# Patient Record
Sex: Female | Born: 1970 | Hispanic: No | Marital: Married | Smoking: Never smoker
Health system: Southern US, Community
[De-identification: ages and names within clinical notes are randomized; demographics above are authoritative.]

## PROBLEM LIST (undated history)

## (undated) DIAGNOSIS — M549 Dorsalgia, unspecified: Secondary | ICD-10-CM

## (undated) DIAGNOSIS — F419 Anxiety disorder, unspecified: Secondary | ICD-10-CM

## (undated) DIAGNOSIS — T7840XA Allergy, unspecified, initial encounter: Secondary | ICD-10-CM

## (undated) HISTORY — PX: BREAST CYST ASPIRATION: SHX578

## (undated) HISTORY — DX: Allergy, unspecified, initial encounter: T78.40XA

## (undated) HISTORY — DX: Anxiety disorder, unspecified: F41.9

---

## 2011-05-14 DIAGNOSIS — G43909 Migraine, unspecified, not intractable, without status migrainosus: Secondary | ICD-10-CM | POA: Insufficient documentation

## 2011-05-15 DIAGNOSIS — E559 Vitamin D deficiency, unspecified: Secondary | ICD-10-CM | POA: Insufficient documentation

## 2011-11-16 ENCOUNTER — Other Ambulatory Visit: Payer: Self-pay | Admitting: Family Medicine

## 2011-11-16 DIAGNOSIS — N6320 Unspecified lump in the left breast, unspecified quadrant: Secondary | ICD-10-CM

## 2011-11-16 DIAGNOSIS — N6310 Unspecified lump in the right breast, unspecified quadrant: Secondary | ICD-10-CM

## 2011-11-19 ENCOUNTER — Ambulatory Visit
Admission: RE | Admit: 2011-11-19 | Discharge: 2011-11-19 | Disposition: A | Discharge status: Home / Self Care | Payer: BC Managed Care – PPO | Source: Ambulatory Visit | Attending: Family Medicine | Admitting: Family Medicine

## 2011-11-19 ENCOUNTER — Other Ambulatory Visit: Payer: Self-pay | Admitting: Family Medicine

## 2011-11-19 DIAGNOSIS — N6320 Unspecified lump in the left breast, unspecified quadrant: Secondary | ICD-10-CM

## 2011-12-24 DIAGNOSIS — N6019 Diffuse cystic mastopathy of unspecified breast: Secondary | ICD-10-CM | POA: Insufficient documentation

## 2011-12-24 DIAGNOSIS — J309 Allergic rhinitis, unspecified: Secondary | ICD-10-CM | POA: Insufficient documentation

## 2012-11-16 ENCOUNTER — Other Ambulatory Visit: Payer: Self-pay | Admitting: Obstetrics and Gynecology

## 2012-11-16 DIAGNOSIS — N6320 Unspecified lump in the left breast, unspecified quadrant: Secondary | ICD-10-CM

## 2012-11-25 ENCOUNTER — Ambulatory Visit
Admission: RE | Admit: 2012-11-25 | Discharge: 2012-11-25 | Disposition: A | Discharge status: Home / Self Care | Payer: BC Managed Care – PPO | Source: Ambulatory Visit | Attending: Obstetrics and Gynecology | Admitting: Obstetrics and Gynecology

## 2012-11-25 ENCOUNTER — Other Ambulatory Visit: Payer: Self-pay | Admitting: Obstetrics and Gynecology

## 2012-11-25 DIAGNOSIS — N6320 Unspecified lump in the left breast, unspecified quadrant: Secondary | ICD-10-CM

## 2013-02-14 ENCOUNTER — Other Ambulatory Visit: Payer: Self-pay | Admitting: Obstetrics and Gynecology

## 2013-02-14 DIAGNOSIS — N644 Mastodynia: Secondary | ICD-10-CM

## 2013-02-14 DIAGNOSIS — N6320 Unspecified lump in the left breast, unspecified quadrant: Secondary | ICD-10-CM

## 2013-02-27 ENCOUNTER — Other Ambulatory Visit: Payer: BC Managed Care – PPO

## 2013-03-02 ENCOUNTER — Ambulatory Visit
Admission: RE | Admit: 2013-03-02 | Discharge: 2013-03-02 | Disposition: A | Discharge status: Home / Self Care | Payer: BC Managed Care – PPO | Source: Ambulatory Visit | Attending: Obstetrics and Gynecology | Admitting: Obstetrics and Gynecology

## 2013-03-02 ENCOUNTER — Other Ambulatory Visit: Payer: Self-pay | Admitting: Obstetrics and Gynecology

## 2013-03-02 DIAGNOSIS — N6320 Unspecified lump in the left breast, unspecified quadrant: Secondary | ICD-10-CM

## 2013-03-02 DIAGNOSIS — N644 Mastodynia: Secondary | ICD-10-CM

## 2015-06-17 ENCOUNTER — Other Ambulatory Visit: Payer: Self-pay | Admitting: Obstetrics and Gynecology

## 2015-06-17 ENCOUNTER — Encounter: Payer: Self-pay | Admitting: Internal Medicine

## 2015-06-17 DIAGNOSIS — N6310 Unspecified lump in the right breast, unspecified quadrant: Secondary | ICD-10-CM

## 2015-06-24 ENCOUNTER — Other Ambulatory Visit: Payer: Self-pay

## 2015-06-25 ENCOUNTER — Ambulatory Visit: Payer: Self-pay | Admitting: Internal Medicine

## 2015-06-26 ENCOUNTER — Other Ambulatory Visit: Payer: Self-pay | Admitting: Obstetrics and Gynecology

## 2015-06-26 ENCOUNTER — Ambulatory Visit
Admission: RE | Admit: 2015-06-26 | Discharge: 2015-06-26 | Disposition: A | Discharge status: Home / Self Care | Payer: BLUE CROSS/BLUE SHIELD | Source: Ambulatory Visit | Attending: Obstetrics and Gynecology | Admitting: Obstetrics and Gynecology

## 2015-06-26 DIAGNOSIS — N6320 Unspecified lump in the left breast, unspecified quadrant: Secondary | ICD-10-CM

## 2015-06-26 DIAGNOSIS — N6310 Unspecified lump in the right breast, unspecified quadrant: Secondary | ICD-10-CM

## 2016-06-18 DIAGNOSIS — R42 Dizziness and giddiness: Secondary | ICD-10-CM | POA: Diagnosis not present

## 2016-06-18 DIAGNOSIS — Z01419 Encounter for gynecological examination (general) (routine) without abnormal findings: Secondary | ICD-10-CM | POA: Diagnosis not present

## 2016-06-18 DIAGNOSIS — Z6824 Body mass index (BMI) 24.0-24.9, adult: Secondary | ICD-10-CM | POA: Diagnosis not present

## 2016-06-18 DIAGNOSIS — N951 Menopausal and female climacteric states: Secondary | ICD-10-CM | POA: Diagnosis not present

## 2016-06-24 DIAGNOSIS — Z23 Encounter for immunization: Secondary | ICD-10-CM | POA: Diagnosis not present

## 2016-06-29 DIAGNOSIS — Z1231 Encounter for screening mammogram for malignant neoplasm of breast: Secondary | ICD-10-CM | POA: Diagnosis not present

## 2016-06-29 DIAGNOSIS — N6002 Solitary cyst of left breast: Secondary | ICD-10-CM | POA: Diagnosis not present

## 2016-09-15 DIAGNOSIS — R8299 Other abnormal findings in urine: Secondary | ICD-10-CM | POA: Diagnosis not present

## 2016-09-15 DIAGNOSIS — Z0000 Encounter for general adult medical examination without abnormal findings: Secondary | ICD-10-CM | POA: Diagnosis not present

## 2016-10-02 DIAGNOSIS — Z1389 Encounter for screening for other disorder: Secondary | ICD-10-CM | POA: Diagnosis not present

## 2016-10-02 DIAGNOSIS — Z0000 Encounter for general adult medical examination without abnormal findings: Secondary | ICD-10-CM | POA: Diagnosis not present

## 2016-10-02 DIAGNOSIS — M2012 Hallux valgus (acquired), left foot: Secondary | ICD-10-CM | POA: Diagnosis not present

## 2016-10-02 DIAGNOSIS — R5383 Other fatigue: Secondary | ICD-10-CM | POA: Diagnosis not present

## 2016-10-02 DIAGNOSIS — M6530 Trigger finger, unspecified finger: Secondary | ICD-10-CM | POA: Diagnosis not present

## 2016-10-02 DIAGNOSIS — M25519 Pain in unspecified shoulder: Secondary | ICD-10-CM | POA: Diagnosis not present

## 2016-10-16 ENCOUNTER — Ambulatory Visit: Payer: BLUE CROSS/BLUE SHIELD | Admitting: Podiatry

## 2016-10-16 DIAGNOSIS — G5602 Carpal tunnel syndrome, left upper limb: Secondary | ICD-10-CM | POA: Diagnosis not present

## 2016-10-16 DIAGNOSIS — M47812 Spondylosis without myelopathy or radiculopathy, cervical region: Secondary | ICD-10-CM | POA: Diagnosis not present

## 2016-10-16 DIAGNOSIS — M069 Rheumatoid arthritis, unspecified: Secondary | ICD-10-CM | POA: Diagnosis not present

## 2016-10-16 DIAGNOSIS — G5603 Carpal tunnel syndrome, bilateral upper limbs: Secondary | ICD-10-CM | POA: Diagnosis not present

## 2016-10-22 ENCOUNTER — Encounter: Payer: Self-pay | Admitting: Podiatry

## 2016-10-22 ENCOUNTER — Ambulatory Visit (INDEPENDENT_AMBULATORY_CARE_PROVIDER_SITE_OTHER): Payer: BLUE CROSS/BLUE SHIELD

## 2016-10-22 ENCOUNTER — Ambulatory Visit (INDEPENDENT_AMBULATORY_CARE_PROVIDER_SITE_OTHER): Payer: BLUE CROSS/BLUE SHIELD | Admitting: Podiatry

## 2016-10-22 VITALS — BP 100/60 | HR 74 | Resp 16 | Ht 64.0 in | Wt 146.0 lb

## 2016-10-22 DIAGNOSIS — M21619 Bunion of unspecified foot: Secondary | ICD-10-CM | POA: Diagnosis not present

## 2016-10-22 DIAGNOSIS — M2010 Hallux valgus (acquired), unspecified foot: Secondary | ICD-10-CM

## 2016-10-22 NOTE — Patient Instructions (Signed)
Bunionectomy A bunionectomy is a surgical procedure to remove a bunion. A bunion is a visible bump of bone on the inside of your foot where your big toe meets the rest of your foot. A bunion can develop when pressure turns this bone (first metatarsal) toward the other toes. Shoes that are too tight are the most common cause of bunions. Bunions can also be caused by diseases, such as arthritis and polio. You may need a bunionectomy if your bunion is very large and painful or it affects your ability to walk. Tell a health care provider about:  Any allergies you have.  All medicines you are taking, including vitamins, herbs, eye drops, creams, and over-the-counter medicines.  Any problems you or family members have had with anesthetic medicines.  Any blood disorders you have.  Any surgeries you have had.  Any medical conditions you have. What are the risks? Generally, this is a safe procedure. However, problems may occur, including:  Infection.  Pain.  Nerve damage.  Bleeding or blood clots.  Reactions to medicines.  Numbness, stiffness, or arthritis in your toe.  Foot problems that continue even after the procedure. What happens before the procedure?  Ask your health care provider about:  Changing or stopping your regular medicines. This is especially important if you are taking diabetes medicines or blood thinners.  Taking medicines such as aspirin and ibuprofen. These medicines can thin your blood. Do not take these medicines before your procedure if your health care provider instructs you not to.  Do not drink alcohol before the procedure as directed by your health care provider.  Do not use tobacco products, including cigarettes, chewing tobacco, or electronic cigarettes, before the procedure as directed by your health care provider. If you need help quitting, ask your health care provider.  Ask your health care provider what kind of medicine you will be given during  your procedure. A bunionectomy may be done using one of these:  A medicine that numbs the area (local anesthetic).  A medicine that makes you go to sleep (general anesthetic). If you will be given general anesthetic, do not eat or drink anything after midnight on the night before the procedure or as directed by your health care provider. What happens during the procedure?  An IV tube may be inserted into a vein.  You will be given local anesthetic or general anesthetic.  The surgeon will make a cut (incision) over the enlarged area at the first joint of the big toe. The surgeon will remove the bunion.  You may have more than one incision if any of the bones in your big toe need to be moved. A bone itself may need to be cut.  Sometimes the tissues around the big toe may also need to be cut then tightened or loosened to reposition the toe.  Screws or other hardware may be used to keep your foot in thecorrect position.  The incision will be closed with stitches (sutures) and covered with adhesive strips or another type of bandage (dressing). What happens after the procedure?  You may spend some time in a recovery area.  Your blood pressure, heart rate, breathing rate, and blood oxygen level will be monitored often until the medicines you were given have worn off. This information is not intended to replace advice given to you by your health care provider. Make sure you discuss any questions you have with your health care provider. Document Released: 07/24/2005 Document Revised: 01/16/2016 Document Reviewed: 03/28/2014   [employer]vier Interactive Patient Education  2017 [employer]  

## 2016-10-22 NOTE — Progress Notes (Signed)
   Subjective:    Patient ID: Madeline Wallace, female    DOB: 1970/11/25, 46 y.o.   MRN: [id number]  HPI Chief Complaint  Patient presents with  . Foot Pain    Bilateral; bunions; pt stated, "Left foot is painful and getting worse"      Review of Systems  Constitutional: Positive for appetite change and unexpected weight change.  Gastrointestinal: Positive for abdominal distention.  Musculoskeletal: Positive for myalgias.  All other systems reviewed and are negative.      Objective:   Physical Exam        Assessment & Plan:

## 2016-10-23 NOTE — Progress Notes (Signed)
Subjective:     Patient ID: Madeline Wallace, female   DOB: 1970-09-26, 46 y.o.   MRN: [id number]  HPI patient presents with painful bunion deformity left over right foot with inability to wear shoe gear comfortably and states she's tried wider shoes she's tried soaks without relief of symptoms and it's been going on for a fairly long time and worse over the last several years   Review of Systems  All other systems reviewed and are negative.      Objective:   Physical Exam  Constitutional: She is oriented to person, place, and time.  Cardiovascular: Intact distal pulses.   Musculoskeletal: Normal range of motion.  Neurological: She is oriented to person, place, and time.  Skin: Skin is warm.  Nursing note and vitals reviewed.  neurovascular status intact muscle strength adequate range of motion within normal limits with patient found to have hyperostosis medial aspect first metatarsal head left with redness around the bone surface and pain when palpated. The right is mildly enlarged with no where near the degree of redness or pain associated with it. Patient's found have good digital perfusion and is well oriented 3     Assessment:     Structural bunion deformity left over right with pain redness and deformity    Plan:     H&P x-rays reviewed condition discussed at great length. Patient wants to have this fixed and I recommended distal osteotomy reviewing procedure and recovery. She is scheduled for surgery in the next several months and will be seen back prior to procedure for consult to go over in great detail what we'll be done with this particular procedure  X-rays taken today indicated elevation of the intermetatarsal angle left over right of approximate 15. Patient has mild assisting within the first metatarsal head but it is localized to the medial side

## 2016-11-09 ENCOUNTER — Ambulatory Visit (INDEPENDENT_AMBULATORY_CARE_PROVIDER_SITE_OTHER): Payer: BLUE CROSS/BLUE SHIELD | Admitting: Podiatry

## 2016-11-09 ENCOUNTER — Encounter: Payer: Self-pay | Admitting: Podiatry

## 2016-11-09 DIAGNOSIS — M21619 Bunion of unspecified foot: Secondary | ICD-10-CM

## 2016-11-09 NOTE — Patient Instructions (Signed)

## 2016-11-09 NOTE — Progress Notes (Signed)
Subjective:     Patient ID: Madeline Wallace, female   DOB: [birth year], 46 y.o.   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 I explained the surgery and the fact there will be pin fixation and the fact recovery takes proximally 6 months. Patient wants surgery signed consent form is given all preoperative instructions and is encouraged to call with questions and today I did go ahead and dispensed air fracture walker for the postop and explained for her to get used to before and find shoes that are comfortable on her other foot

## 2016-11-16 ENCOUNTER — Telehealth: Payer: Self-pay | Admitting: *Deleted

## 2016-11-16 NOTE — Telephone Encounter (Signed)
"  I was told to call you to see if you got any more boots in.  When I was there, I was given a large one but I was told I could get a smaller one when it came in.  I was told that a small one was too little."  We do have some in now.  You can bring the one you have and exchange it for a smaller boot.  "I will come by today."

## 2016-11-20 ENCOUNTER — Telehealth: Payer: Self-pay

## 2016-11-20 NOTE — Telephone Encounter (Signed)
Returned patient's call, answering concerns regarding anesthesia and surgery. She is to call with any other questions or concerns

## 2016-11-24 ENCOUNTER — Encounter: Payer: Self-pay | Admitting: Podiatry

## 2016-11-24 DIAGNOSIS — Z01818 Encounter for other preprocedural examination: Secondary | ICD-10-CM | POA: Diagnosis not present

## 2016-11-24 DIAGNOSIS — M2012 Hallux valgus (acquired), left foot: Secondary | ICD-10-CM | POA: Diagnosis not present

## 2016-11-24 DIAGNOSIS — M21612 Bunion of left foot: Secondary | ICD-10-CM | POA: Diagnosis not present

## 2016-11-27 ENCOUNTER — Telehealth: Payer: Self-pay | Admitting: *Deleted

## 2016-11-27 MED ORDER — HYDROCODONE-ACETAMINOPHEN 5-325 MG PO TABS: 1.0000 | ORAL_TABLET | Freq: Four times a day (QID) | ORAL | 0 refills | Status: DC | PRN

## 2016-11-27 MED ORDER — PROMETHAZINE HCL 25 MG PO TABS: ORAL_TABLET | ORAL | 0 refills | Status: DC

## 2016-11-27 NOTE — Telephone Encounter (Addendum)
Pt's husband, Arlys John states pt is not doing well, she feels faint, sick and constipated. I spoke with pt and she states she is getting sick from the pain medication oxycodone, and the ondansetron can only be taken every 8 hours. I asked pt if she was taking the oxycodone with food and she states no because she is too sick to eat. I told pt I would seeif the doctor on call would change the pain medication and the nausea medication to one she could take every 4-6 hours, and I would call again. Dr. Ardelle Anton ordered Hydrocodone 5/325mg  #40 one or two tablets every 6 hours prn foot pain, and Phenergan  #30 one tablet every 4-6 hours prn nausea. I informed pt's husband, Arlys John of Dr. Gabriel Rung orders and to bring the Percocet to office to be destroyed. Arlys John states understanding.

## 2016-11-30 ENCOUNTER — Ambulatory Visit: Payer: BLUE CROSS/BLUE SHIELD

## 2016-11-30 ENCOUNTER — Ambulatory Visit (INDEPENDENT_AMBULATORY_CARE_PROVIDER_SITE_OTHER): Payer: BLUE CROSS/BLUE SHIELD | Admitting: Podiatry

## 2016-11-30 ENCOUNTER — Encounter: Payer: Self-pay | Admitting: Podiatry

## 2016-11-30 VITALS — BP 112/72 | HR 70 | Temp 96.5°F

## 2016-11-30 DIAGNOSIS — Z9889 Other specified postprocedural states: Secondary | ICD-10-CM

## 2016-11-30 DIAGNOSIS — M21619 Bunion of unspecified foot: Secondary | ICD-10-CM

## 2016-11-30 NOTE — Progress Notes (Signed)
Subjective:     Patient ID: Madeline Wallace, female   DOB: 09-26-1970, 46 y.o.   MRN: [id number]  HPI patient presents stating she still getting swelling but overall is doing okay   Review of Systems     Objective:   Physical Exam Neurovascular status intact with patient found to have a well structured bunion site correction with wound edges well coapted and good alignment    Assessment:     Doing well overall with mild edema consistent with this. Postop    Plan:     Reviewed continued conservative care and reapplied sterile dressing along with continued elevation compression immobilization. Explained range of motion exercises and reviewed x-ray  X-ray indicates osteotomy is healing well with good positional component and fixation in place

## 2016-12-14 ENCOUNTER — Ambulatory Visit (INDEPENDENT_AMBULATORY_CARE_PROVIDER_SITE_OTHER): Payer: BLUE CROSS/BLUE SHIELD

## 2016-12-14 ENCOUNTER — Ambulatory Visit (INDEPENDENT_AMBULATORY_CARE_PROVIDER_SITE_OTHER): Payer: BLUE CROSS/BLUE SHIELD | Admitting: Podiatry

## 2016-12-14 ENCOUNTER — Encounter: Payer: Self-pay | Admitting: Podiatry

## 2016-12-14 VITALS — BP 112/65 | HR 87 | Resp 16

## 2016-12-14 DIAGNOSIS — J309 Allergic rhinitis, unspecified: Secondary | ICD-10-CM | POA: Diagnosis not present

## 2016-12-14 DIAGNOSIS — M21619 Bunion of unspecified foot: Secondary | ICD-10-CM

## 2016-12-14 DIAGNOSIS — Z9889 Other specified postprocedural states: Secondary | ICD-10-CM

## 2016-12-14 DIAGNOSIS — R42 Dizziness and giddiness: Secondary | ICD-10-CM | POA: Diagnosis not present

## 2016-12-14 DIAGNOSIS — Z6824 Body mass index (BMI) 24.0-24.9, adult: Secondary | ICD-10-CM | POA: Diagnosis not present

## 2016-12-14 NOTE — Progress Notes (Signed)
Subjective:    Patient ID: Madeline Wallace, female   DOB: 46 y.o.   MRN: [id number]   HPI patient presents stating the pain is getting better but it still present    ROS      Objective:  Physical Exam  Neurovascular status intact negative Homans sign noted with well structured first MPJ left with wound edges well coapted and good range of motion    Assessment:     Doing well post osteotomy left first metatarsal    Plan:     H&P condition reviewed and recommended gradual increase in activities with continued boot usage surgical shoe usage compression and elevation. Reappoint 3 weeks or earlier if needed  X-ray report indicates the osteotomy is healing well with good alignment noted pins in place and good correction occurring

## 2017-01-01 NOTE — Progress Notes (Signed)
DOS 11/24/2016 [place] Bunionectomy (cutting and moving bone) with pin fixation left

## 2017-01-04 ENCOUNTER — Ambulatory Visit (INDEPENDENT_AMBULATORY_CARE_PROVIDER_SITE_OTHER): Payer: BLUE CROSS/BLUE SHIELD

## 2017-01-04 ENCOUNTER — Ambulatory Visit (INDEPENDENT_AMBULATORY_CARE_PROVIDER_SITE_OTHER): Payer: BLUE CROSS/BLUE SHIELD | Admitting: Podiatry

## 2017-01-04 DIAGNOSIS — Z9889 Other specified postprocedural states: Secondary | ICD-10-CM

## 2017-01-04 DIAGNOSIS — M21619 Bunion of unspecified foot: Secondary | ICD-10-CM | POA: Diagnosis not present

## 2017-01-05 NOTE — Progress Notes (Signed)
Subjective:    Patient ID: Madeline Wallace, female   DOB: 46 y.o.   MRN: [id number]   HPI patient states she is doing well with her surgery    ROS      Objective:  Physical Exam Neurovascular status intact negative Homans sign was noted with patient's left foot doing well with wound edges well coapted hallux in rectus position and good range of motion of the first MPJ    Assessment:    Doing well post osteotomy first metatarsal left     Plan:     H&P condition reviewed and recommended continued shoe gear usage gradual return to activity and reappoint again in 6 weeks with x-ray review accomplished today  X-ray indicates the osteotomy is healing well with pins in place and no indications of movement

## 2017-02-15 ENCOUNTER — Ambulatory Visit (INDEPENDENT_AMBULATORY_CARE_PROVIDER_SITE_OTHER): Payer: BLUE CROSS/BLUE SHIELD

## 2017-02-15 ENCOUNTER — Ambulatory Visit (INDEPENDENT_AMBULATORY_CARE_PROVIDER_SITE_OTHER): Payer: BLUE CROSS/BLUE SHIELD | Admitting: Podiatry

## 2017-02-15 DIAGNOSIS — Z9889 Other specified postprocedural states: Secondary | ICD-10-CM

## 2017-02-15 DIAGNOSIS — M21619 Bunion of unspecified foot: Secondary | ICD-10-CM

## 2017-02-15 NOTE — Progress Notes (Signed)
Subjective:    Patient ID: Madeline Wallace, female   DOB: 46 y.o.   MRN: [id number]   HPI patient states she's been in regular shoes for a month and having minimal discomfort with her left foot    ROS      Objective:  Physical Exam neurovascular status intact negative Homans sign was noted with patient's left foot healing well with wound edges well coapted good range of motion and no crepitus within the joint     Assessment:    Doing well post osteotomy left     Plan:    X-ray reviewed and advised on continued elevation compression and gradual increase in activities and gradual increase in the type of activity she does over the next 4-8 weeks  X-rays indicate the osteotomy is healing well with fixation in place and no indications of movement

## 2017-06-02 DIAGNOSIS — F4321 Adjustment disorder with depressed mood: Secondary | ICD-10-CM | POA: Diagnosis not present

## 2017-06-09 DIAGNOSIS — F4321 Adjustment disorder with depressed mood: Secondary | ICD-10-CM | POA: Diagnosis not present

## 2017-06-16 DIAGNOSIS — F4321 Adjustment disorder with depressed mood: Secondary | ICD-10-CM | POA: Diagnosis not present

## 2017-06-16 DIAGNOSIS — Z23 Encounter for immunization: Secondary | ICD-10-CM | POA: Diagnosis not present

## 2017-06-17 ENCOUNTER — Ambulatory Visit: Payer: BLUE CROSS/BLUE SHIELD | Admitting: Licensed Clinical Social Worker

## 2017-06-23 DIAGNOSIS — F4321 Adjustment disorder with depressed mood: Secondary | ICD-10-CM | POA: Diagnosis not present

## 2017-06-30 DIAGNOSIS — F4321 Adjustment disorder with depressed mood: Secondary | ICD-10-CM | POA: Diagnosis not present

## 2017-07-05 ENCOUNTER — Encounter: Payer: Self-pay | Admitting: Podiatry

## 2017-07-05 ENCOUNTER — Ambulatory Visit (INDEPENDENT_AMBULATORY_CARE_PROVIDER_SITE_OTHER): Payer: BLUE CROSS/BLUE SHIELD

## 2017-07-05 ENCOUNTER — Ambulatory Visit: Payer: BLUE CROSS/BLUE SHIELD | Admitting: Podiatry

## 2017-07-05 DIAGNOSIS — B351 Tinea unguium: Secondary | ICD-10-CM

## 2017-07-05 DIAGNOSIS — M2012 Hallux valgus (acquired), left foot: Secondary | ICD-10-CM | POA: Diagnosis not present

## 2017-07-05 DIAGNOSIS — Z472 Encounter for removal of internal fixation device: Secondary | ICD-10-CM | POA: Diagnosis not present

## 2017-07-05 NOTE — Progress Notes (Signed)
Subjective:    Patient ID: Madeline Wallace, female   DOB: 46 y.o.   MRN: [id number]   HPI patient is concerned because she still is getting some discomfort around her bunion site when she does a lot of walking. Patient has tried running in that's been hard for her. Also concerned about discoloration of 3 toenails on the left foot    ROS      Objective:  Physical Exam neurovascular status intact with patient's left foot still mildly swollen but minimal with good range of motion and no crepitus. Patient did not have pain when I palpated today     Assessment:    Mild discomfort left foot with possibility for irritation from pin or other pathology and also concerned about discoloration of 3 toenails     Plan:    H&P discussed both conditions. If the symptoms were to persist for the next several months I would consider pin removal and explained that to her and at this time I did place on a topical antifungal to reduce any fungal element of these 3 nails and discussed possible laser. Patient will be seen back as needed  X-rays indicate excellent healing with no signs of motion with pins in place

## 2017-07-07 DIAGNOSIS — F4321 Adjustment disorder with depressed mood: Secondary | ICD-10-CM | POA: Diagnosis not present

## 2017-07-14 DIAGNOSIS — M542 Cervicalgia: Secondary | ICD-10-CM | POA: Diagnosis not present

## 2017-07-14 DIAGNOSIS — M545 Low back pain: Secondary | ICD-10-CM | POA: Diagnosis not present

## 2017-07-14 DIAGNOSIS — M79672 Pain in left foot: Secondary | ICD-10-CM | POA: Diagnosis not present

## 2017-07-19 DIAGNOSIS — M542 Cervicalgia: Secondary | ICD-10-CM | POA: Diagnosis not present

## 2017-07-19 DIAGNOSIS — M79672 Pain in left foot: Secondary | ICD-10-CM | POA: Diagnosis not present

## 2017-07-19 DIAGNOSIS — M545 Low back pain: Secondary | ICD-10-CM | POA: Diagnosis not present

## 2017-07-22 DIAGNOSIS — M79672 Pain in left foot: Secondary | ICD-10-CM | POA: Diagnosis not present

## 2017-07-22 DIAGNOSIS — M545 Low back pain: Secondary | ICD-10-CM | POA: Diagnosis not present

## 2017-07-22 DIAGNOSIS — F4321 Adjustment disorder with depressed mood: Secondary | ICD-10-CM | POA: Diagnosis not present

## 2017-07-22 DIAGNOSIS — M542 Cervicalgia: Secondary | ICD-10-CM | POA: Diagnosis not present

## 2017-07-29 DIAGNOSIS — Z1231 Encounter for screening mammogram for malignant neoplasm of breast: Secondary | ICD-10-CM | POA: Diagnosis not present

## 2017-07-30 DIAGNOSIS — M79672 Pain in left foot: Secondary | ICD-10-CM | POA: Diagnosis not present

## 2017-07-30 DIAGNOSIS — M542 Cervicalgia: Secondary | ICD-10-CM | POA: Diagnosis not present

## 2017-07-30 DIAGNOSIS — M545 Low back pain: Secondary | ICD-10-CM | POA: Diagnosis not present

## 2017-08-10 DIAGNOSIS — F4321 Adjustment disorder with depressed mood: Secondary | ICD-10-CM | POA: Diagnosis not present

## 2017-09-01 DIAGNOSIS — F4321 Adjustment disorder with depressed mood: Secondary | ICD-10-CM | POA: Diagnosis not present

## 2017-09-02 DIAGNOSIS — H43393 Other vitreous opacities, bilateral: Secondary | ICD-10-CM | POA: Diagnosis not present

## 2017-09-02 DIAGNOSIS — H35363 Drusen (degenerative) of macula, bilateral: Secondary | ICD-10-CM | POA: Diagnosis not present

## 2017-09-02 DIAGNOSIS — H25013 Cortical age-related cataract, bilateral: Secondary | ICD-10-CM | POA: Diagnosis not present

## 2017-09-02 DIAGNOSIS — H11131 Conjunctival pigmentations, right eye: Secondary | ICD-10-CM | POA: Diagnosis not present

## 2017-09-08 DIAGNOSIS — F4321 Adjustment disorder with depressed mood: Secondary | ICD-10-CM | POA: Diagnosis not present

## 2017-09-16 DIAGNOSIS — F4321 Adjustment disorder with depressed mood: Secondary | ICD-10-CM | POA: Diagnosis not present

## 2017-09-22 DIAGNOSIS — F4321 Adjustment disorder with depressed mood: Secondary | ICD-10-CM | POA: Diagnosis not present

## 2017-09-29 DIAGNOSIS — F4321 Adjustment disorder with depressed mood: Secondary | ICD-10-CM | POA: Diagnosis not present

## 2017-10-06 DIAGNOSIS — Z0000 Encounter for general adult medical examination without abnormal findings: Secondary | ICD-10-CM | POA: Diagnosis not present

## 2017-10-08 DIAGNOSIS — F4321 Adjustment disorder with depressed mood: Secondary | ICD-10-CM | POA: Diagnosis not present

## 2017-10-13 DIAGNOSIS — Z23 Encounter for immunization: Secondary | ICD-10-CM | POA: Diagnosis not present

## 2017-10-13 DIAGNOSIS — Z0000 Encounter for general adult medical examination without abnormal findings: Secondary | ICD-10-CM | POA: Diagnosis not present

## 2017-10-13 DIAGNOSIS — B351 Tinea unguium: Secondary | ICD-10-CM | POA: Diagnosis not present

## 2017-10-13 DIAGNOSIS — F331 Major depressive disorder, recurrent, moderate: Secondary | ICD-10-CM | POA: Diagnosis not present

## 2017-10-13 DIAGNOSIS — Z1389 Encounter for screening for other disorder: Secondary | ICD-10-CM | POA: Diagnosis not present

## 2017-10-13 DIAGNOSIS — M79672 Pain in left foot: Secondary | ICD-10-CM | POA: Diagnosis not present

## 2017-10-13 DIAGNOSIS — M79646 Pain in unspecified finger(s): Secondary | ICD-10-CM | POA: Diagnosis not present

## 2017-10-14 DIAGNOSIS — Z1212 Encounter for screening for malignant neoplasm of rectum: Secondary | ICD-10-CM | POA: Diagnosis not present

## 2017-10-18 DIAGNOSIS — F4321 Adjustment disorder with depressed mood: Secondary | ICD-10-CM | POA: Diagnosis not present

## 2017-10-25 DIAGNOSIS — F4321 Adjustment disorder with depressed mood: Secondary | ICD-10-CM | POA: Diagnosis not present

## 2017-10-27 ENCOUNTER — Other Ambulatory Visit: Payer: Self-pay | Admitting: Obstetrics and Gynecology

## 2017-10-27 DIAGNOSIS — Z01419 Encounter for gynecological examination (general) (routine) without abnormal findings: Secondary | ICD-10-CM | POA: Diagnosis not present

## 2017-10-27 DIAGNOSIS — N6002 Solitary cyst of left breast: Secondary | ICD-10-CM

## 2017-10-27 DIAGNOSIS — Z6823 Body mass index (BMI) 23.0-23.9, adult: Secondary | ICD-10-CM | POA: Diagnosis not present

## 2017-11-02 DIAGNOSIS — F4321 Adjustment disorder with depressed mood: Secondary | ICD-10-CM | POA: Diagnosis not present

## 2017-11-05 ENCOUNTER — Ambulatory Visit
Admission: RE | Admit: 2017-11-05 | Discharge: 2017-11-05 | Disposition: A | Discharge status: Home / Self Care | Payer: BLUE CROSS/BLUE SHIELD | Source: Ambulatory Visit | Attending: Obstetrics and Gynecology | Admitting: Obstetrics and Gynecology

## 2017-11-05 DIAGNOSIS — R922 Inconclusive mammogram: Secondary | ICD-10-CM | POA: Diagnosis not present

## 2017-11-05 DIAGNOSIS — N6320 Unspecified lump in the left breast, unspecified quadrant: Secondary | ICD-10-CM | POA: Diagnosis not present

## 2017-11-05 DIAGNOSIS — N6002 Solitary cyst of left breast: Secondary | ICD-10-CM

## 2017-11-15 DIAGNOSIS — F418 Other specified anxiety disorders: Secondary | ICD-10-CM | POA: Diagnosis not present

## 2017-11-15 DIAGNOSIS — F4321 Adjustment disorder with depressed mood: Secondary | ICD-10-CM | POA: Diagnosis not present

## 2017-11-23 DIAGNOSIS — F418 Other specified anxiety disorders: Secondary | ICD-10-CM | POA: Diagnosis not present

## 2017-11-23 DIAGNOSIS — F4321 Adjustment disorder with depressed mood: Secondary | ICD-10-CM | POA: Diagnosis not present

## 2017-12-03 DIAGNOSIS — F418 Other specified anxiety disorders: Secondary | ICD-10-CM | POA: Diagnosis not present

## 2017-12-03 DIAGNOSIS — F4321 Adjustment disorder with depressed mood: Secondary | ICD-10-CM | POA: Diagnosis not present

## 2017-12-08 DIAGNOSIS — F4321 Adjustment disorder with depressed mood: Secondary | ICD-10-CM | POA: Diagnosis not present

## 2017-12-08 DIAGNOSIS — F418 Other specified anxiety disorders: Secondary | ICD-10-CM | POA: Diagnosis not present

## 2017-12-16 DIAGNOSIS — F4321 Adjustment disorder with depressed mood: Secondary | ICD-10-CM | POA: Diagnosis not present

## 2017-12-16 DIAGNOSIS — F418 Other specified anxiety disorders: Secondary | ICD-10-CM | POA: Diagnosis not present

## 2018-01-05 DIAGNOSIS — F418 Other specified anxiety disorders: Secondary | ICD-10-CM | POA: Diagnosis not present

## 2018-01-05 DIAGNOSIS — F4321 Adjustment disorder with depressed mood: Secondary | ICD-10-CM | POA: Diagnosis not present

## 2018-01-31 DIAGNOSIS — F418 Other specified anxiety disorders: Secondary | ICD-10-CM | POA: Diagnosis not present

## 2018-01-31 DIAGNOSIS — F4321 Adjustment disorder with depressed mood: Secondary | ICD-10-CM | POA: Diagnosis not present

## 2018-03-11 DIAGNOSIS — F4321 Adjustment disorder with depressed mood: Secondary | ICD-10-CM | POA: Diagnosis not present

## 2018-03-11 DIAGNOSIS — F418 Other specified anxiety disorders: Secondary | ICD-10-CM | POA: Diagnosis not present

## 2018-04-22 DIAGNOSIS — F4321 Adjustment disorder with depressed mood: Secondary | ICD-10-CM | POA: Diagnosis not present

## 2018-04-22 DIAGNOSIS — F418 Other specified anxiety disorders: Secondary | ICD-10-CM | POA: Diagnosis not present

## 2018-06-10 DIAGNOSIS — Z23 Encounter for immunization: Secondary | ICD-10-CM | POA: Diagnosis not present

## 2018-08-01 DIAGNOSIS — Z1231 Encounter for screening mammogram for malignant neoplasm of breast: Secondary | ICD-10-CM | POA: Diagnosis not present

## 2018-08-04 ENCOUNTER — Ambulatory Visit (INDEPENDENT_AMBULATORY_CARE_PROVIDER_SITE_OTHER): Payer: BLUE CROSS/BLUE SHIELD | Admitting: Podiatry

## 2018-08-04 ENCOUNTER — Other Ambulatory Visit: Payer: Self-pay | Admitting: Podiatry

## 2018-08-04 ENCOUNTER — Ambulatory Visit (INDEPENDENT_AMBULATORY_CARE_PROVIDER_SITE_OTHER): Payer: BLUE CROSS/BLUE SHIELD

## 2018-08-04 ENCOUNTER — Encounter: Payer: Self-pay | Admitting: Podiatry

## 2018-08-04 DIAGNOSIS — M79675 Pain in left toe(s): Secondary | ICD-10-CM

## 2018-08-04 DIAGNOSIS — M779 Enthesopathy, unspecified: Secondary | ICD-10-CM

## 2018-08-04 MED ORDER — TRIAMCINOLONE ACETONIDE 10 MG/ML IJ SUSP
10.0000 mg | Freq: Once | INTRAMUSCULAR | Status: AC
  Administered 2018-08-04: 10 mg

## 2018-08-04 NOTE — Progress Notes (Signed)
Subjective:   Patient ID: Madeline Wallace, female   DOB: [birth year]47 y.o.   MRN: [id number]   HPI Patient presents stating the fourth digit left has been very inflamed over the last month that she is not sure what may have happened in the last weeks really bothered her and she had bunion done last year that is done well for   Review of Systems  All other systems reviewed and are negative.       Objective:  Physical Exam Vitals signs and nursing note reviewed.  Constitutional:      Appearance: She is well-developed.  Pulmonary:     Effort: Pulmonary effort is normal.  Musculoskeletal: Normal range of motion.  Skin:    General: Skin is warm.  Neurological:     Mental Status: She is alert.     Neurovascular status intact muscle strength is adequate range of motion within normal limits with patient found to have inflammation of the lateral side fourth digit left with fluid buildup noted around the inner phalangeal joint proximal.  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

## 2018-08-06 DIAGNOSIS — J029 Acute pharyngitis, unspecified: Secondary | ICD-10-CM | POA: Diagnosis not present

## 2018-08-10 DIAGNOSIS — Z6820 Body mass index (BMI) 20.0-20.9, adult: Secondary | ICD-10-CM | POA: Diagnosis not present

## 2018-08-10 DIAGNOSIS — J019 Acute sinusitis, unspecified: Secondary | ICD-10-CM | POA: Diagnosis not present

## 2018-08-10 DIAGNOSIS — R05 Cough: Secondary | ICD-10-CM | POA: Diagnosis not present

## 2018-08-10 DIAGNOSIS — J9801 Acute bronchospasm: Secondary | ICD-10-CM | POA: Diagnosis not present

## 2018-08-30 DIAGNOSIS — L700 Acne vulgaris: Secondary | ICD-10-CM | POA: Diagnosis not present

## 2018-08-30 DIAGNOSIS — L821 Other seborrheic keratosis: Secondary | ICD-10-CM | POA: Diagnosis not present

## 2018-08-30 DIAGNOSIS — L249 Irritant contact dermatitis, unspecified cause: Secondary | ICD-10-CM | POA: Diagnosis not present

## 2018-08-30 DIAGNOSIS — L719 Rosacea, unspecified: Secondary | ICD-10-CM | POA: Diagnosis not present

## 2018-09-07 DIAGNOSIS — N920 Excessive and frequent menstruation with regular cycle: Secondary | ICD-10-CM | POA: Diagnosis not present

## 2018-09-09 DIAGNOSIS — F418 Other specified anxiety disorders: Secondary | ICD-10-CM | POA: Diagnosis not present

## 2018-09-09 DIAGNOSIS — F4321 Adjustment disorder with depressed mood: Secondary | ICD-10-CM | POA: Diagnosis not present

## 2018-10-06 DIAGNOSIS — N924 Excessive bleeding in the premenopausal period: Secondary | ICD-10-CM | POA: Diagnosis not present

## 2018-10-11 DIAGNOSIS — Z0000 Encounter for general adult medical examination without abnormal findings: Secondary | ICD-10-CM | POA: Diagnosis not present

## 2018-10-11 DIAGNOSIS — R82998 Other abnormal findings in urine: Secondary | ICD-10-CM | POA: Diagnosis not present

## 2018-10-11 DIAGNOSIS — R5383 Other fatigue: Secondary | ICD-10-CM | POA: Diagnosis not present

## 2018-10-18 DIAGNOSIS — Z1212 Encounter for screening for malignant neoplasm of rectum: Secondary | ICD-10-CM | POA: Diagnosis not present

## 2018-10-19 DIAGNOSIS — Z1389 Encounter for screening for other disorder: Secondary | ICD-10-CM | POA: Diagnosis not present

## 2018-10-19 DIAGNOSIS — Z0000 Encounter for general adult medical examination without abnormal findings: Secondary | ICD-10-CM | POA: Diagnosis not present

## 2018-10-19 DIAGNOSIS — Z1331 Encounter for screening for depression: Secondary | ICD-10-CM | POA: Diagnosis not present

## 2018-10-19 DIAGNOSIS — F418 Other specified anxiety disorders: Secondary | ICD-10-CM | POA: Diagnosis not present

## 2018-10-19 DIAGNOSIS — N920 Excessive and frequent menstruation with regular cycle: Secondary | ICD-10-CM | POA: Diagnosis not present

## 2018-10-20 DIAGNOSIS — L719 Rosacea, unspecified: Secondary | ICD-10-CM | POA: Diagnosis not present

## 2018-10-20 DIAGNOSIS — L700 Acne vulgaris: Secondary | ICD-10-CM | POA: Diagnosis not present

## 2018-10-20 DIAGNOSIS — Z23 Encounter for immunization: Secondary | ICD-10-CM | POA: Diagnosis not present

## 2019-03-21 DIAGNOSIS — H25013 Cortical age-related cataract, bilateral: Secondary | ICD-10-CM | POA: Diagnosis not present

## 2019-03-21 DIAGNOSIS — H35363 Drusen (degenerative) of macula, bilateral: Secondary | ICD-10-CM | POA: Diagnosis not present

## 2019-03-21 DIAGNOSIS — H524 Presbyopia: Secondary | ICD-10-CM | POA: Diagnosis not present

## 2019-03-21 DIAGNOSIS — G245 Blepharospasm: Secondary | ICD-10-CM | POA: Diagnosis not present

## 2019-04-13 DIAGNOSIS — R239 Unspecified skin changes: Secondary | ICD-10-CM | POA: Diagnosis not present

## 2019-04-13 DIAGNOSIS — E559 Vitamin D deficiency, unspecified: Secondary | ICD-10-CM | POA: Diagnosis not present

## 2019-04-13 DIAGNOSIS — Z6823 Body mass index (BMI) 23.0-23.9, adult: Secondary | ICD-10-CM | POA: Diagnosis not present

## 2019-04-13 DIAGNOSIS — R5383 Other fatigue: Secondary | ICD-10-CM | POA: Diagnosis not present

## 2019-04-13 DIAGNOSIS — M2550 Pain in unspecified joint: Secondary | ICD-10-CM | POA: Diagnosis not present

## 2019-04-13 DIAGNOSIS — R21 Rash and other nonspecific skin eruption: Secondary | ICD-10-CM | POA: Diagnosis not present

## 2019-04-13 DIAGNOSIS — Z01419 Encounter for gynecological examination (general) (routine) without abnormal findings: Secondary | ICD-10-CM | POA: Diagnosis not present

## 2019-04-20 DIAGNOSIS — L859 Epidermal thickening, unspecified: Secondary | ICD-10-CM | POA: Diagnosis not present

## 2019-04-20 DIAGNOSIS — L719 Rosacea, unspecified: Secondary | ICD-10-CM | POA: Diagnosis not present

## 2019-05-05 DIAGNOSIS — Z23 Encounter for immunization: Secondary | ICD-10-CM | POA: Diagnosis not present

## 2019-05-28 IMAGING — US ULTRASOUND LEFT BREAST LIMITED
1 series · 10 of 10 positions shown · non-contrast
Comparison: Mammography 07/29/2017, 06/29/2016 and earlier.

CLINICAL DATA: 46-year-old presenting with a palpable lump in the
subareolar left breast which she initially noted approximately 2
weeks ago, decreased in size since that time. The lump was painful
during her menstrual period but is not painful currently.

EXAM:
DIGITAL DIAGNOSTIC LEFT MAMMOGRAM WITH CAD AND TOMO
ULTRASOUND LEFT BREAST

[Series 1: ultrasound left breast limited · 0.07mm/px · 10 acquisitions, 10 frames shown]
[im 1/10]
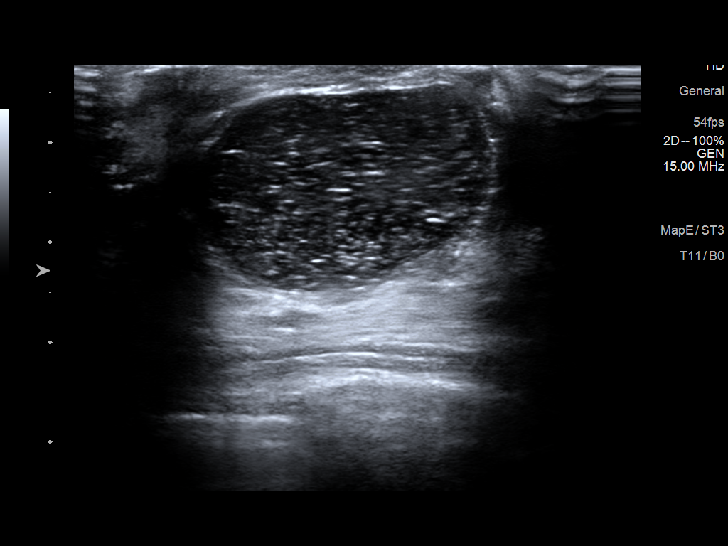
[im 2/10]
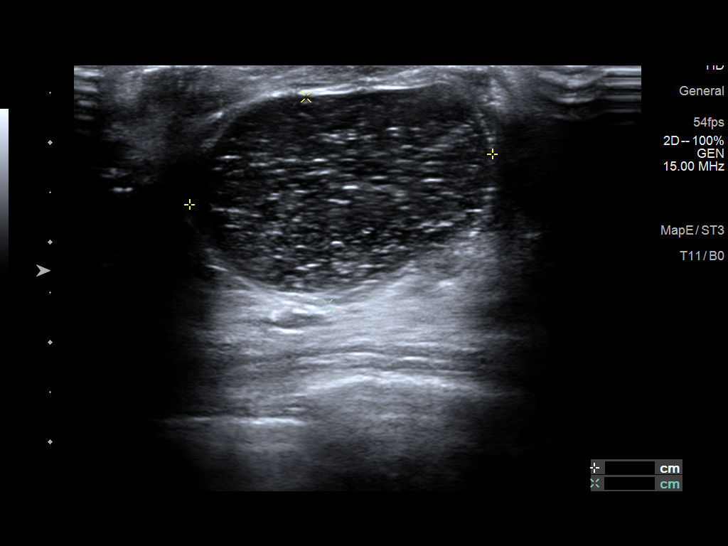
[im 3/10]
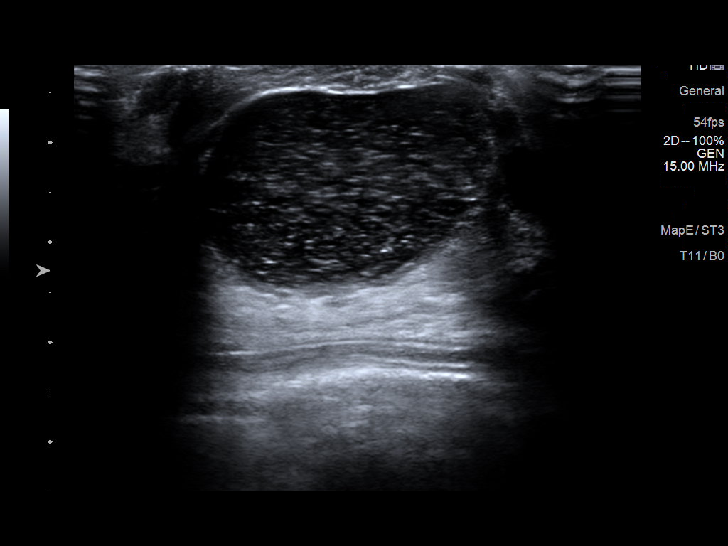
[im 4/10]
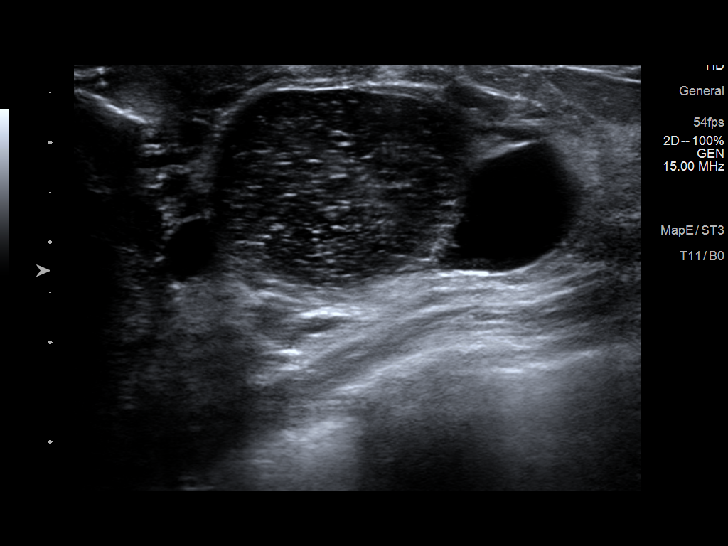
[im 5/10]
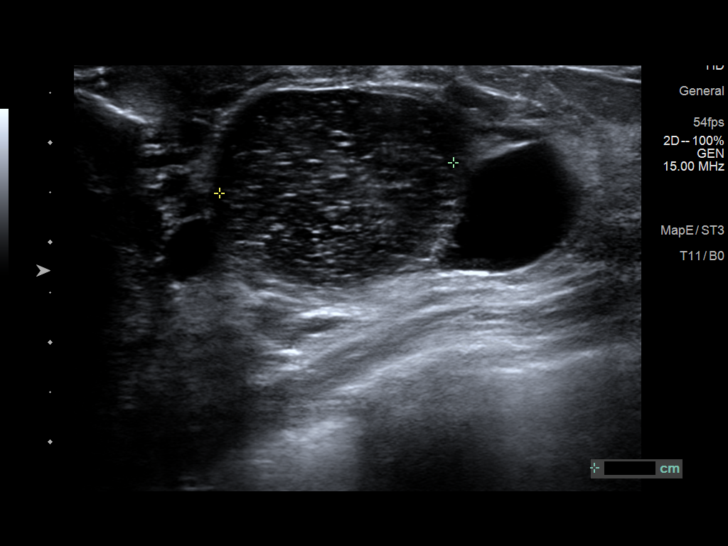
[im 6/10]
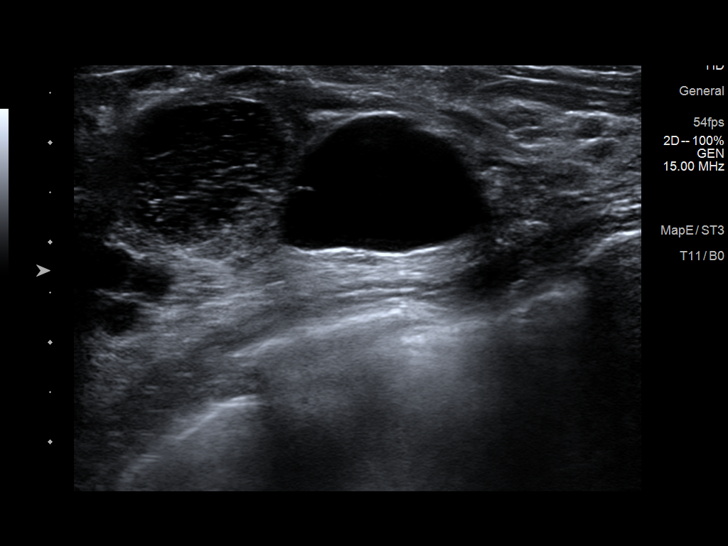
[im 7/10]
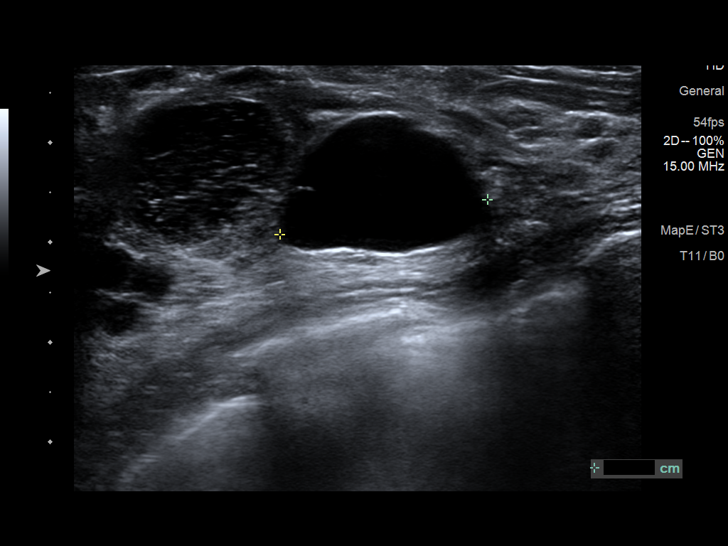
[im 8/10]
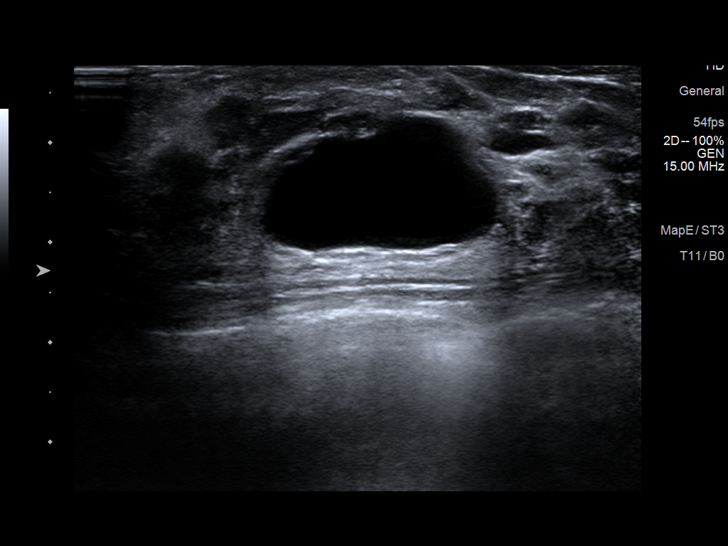
[im 9/10]
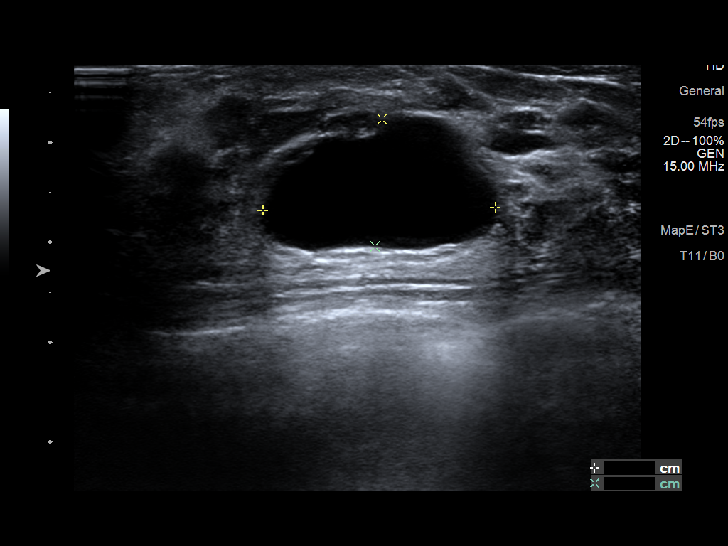
[im 10/10]
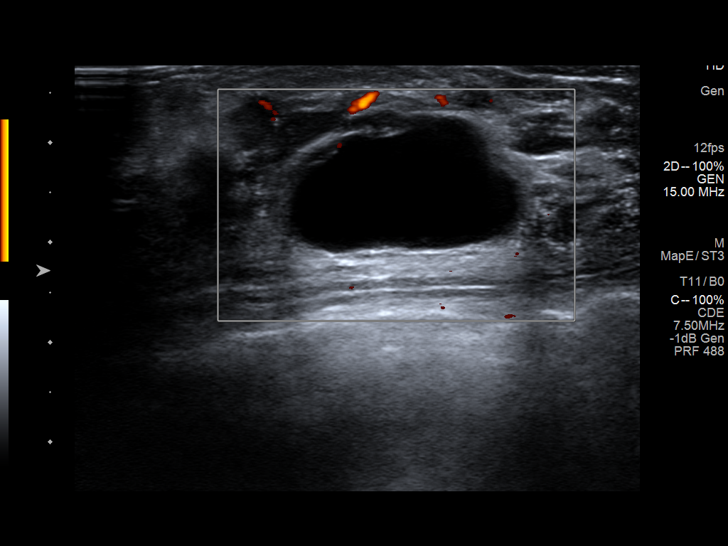

[10 of 10 positions shown; findings below may reference images not displayed]

Left
breast ultrasound 11/25/2012, 12/24/2011.

ACR Breast Density Category d: The breast tissue is extremely dense,
which lowers the sensitivity of mammography.
FINDINGS: Tomosynthesis and synthesized full field CC and MLO views of the
left breast were obtained. Tomosynthesis and synthesized spot
compression tangential view of the palpable concern in the left
breast was also obtained.

Corresponding to the palpable concern is a circumscribed low-density
mass measuring approximately 4 cm in the subareolar location. There
is no associated architectural distortion or suspicious
calcifications.

No suspicious findings elsewhere in the left breast.

Mammographic images were processed with CAD.

On physical exam, there is a palpable mobile approximate 3 cm lump
in the subareolar left breast.

Targeted left breast ultrasound is performed, showing a
circumscribed oval parallel anechoic mass with freely floating
internal echoes at the 4 o'clock position approximately 1 cm from
the nipple measuring approximately 2.4 x 2.1 x 3.1 cm, demonstrating
posterior acoustic enhancement, corresponding to the palpable
concern. An adjacent circumscribed oval parallel anechoic mass at
the same location measures approximately 2.1 x 1.3 x 2.3 cm,
demonstrating posterior acoustic enhancement and no internal power
Doppler flow. No suspicious solid mass or abnormal acoustic
shadowing is identified.
IMPRESSION: 1. Adjacent benign cysts (one complex, one simple) in the lower
inner subareolar left breast which account for the palpable concern.
2. No mammographic or sonographic evidence of malignancy involving
the left breast.

RECOMMENDATION:
Annual bilateral screening mammography which is due in July 2018.

Cyst aspiration was discussed with the patient. However, as she is
currently asymptomatic, she elected to defer aspiration until after
her next menstrual cycle to see if the pain and tenderness recurs.

I have discussed the findings and recommendations with the patient.
Results were also provided in writing at the conclusion of the
visit. If applicable, a reminder letter will be sent to the patient
regarding the next appointment.

BI-RADS CATEGORY  2: Benign.

## 2019-08-03 DIAGNOSIS — E559 Vitamin D deficiency, unspecified: Secondary | ICD-10-CM | POA: Diagnosis not present

## 2019-08-03 DIAGNOSIS — Z1231 Encounter for screening mammogram for malignant neoplasm of breast: Secondary | ICD-10-CM | POA: Diagnosis not present

## 2019-11-07 DIAGNOSIS — R7989 Other specified abnormal findings of blood chemistry: Secondary | ICD-10-CM | POA: Diagnosis not present

## 2019-11-07 DIAGNOSIS — Z0000 Encounter for general adult medical examination without abnormal findings: Secondary | ICD-10-CM | POA: Diagnosis not present

## 2019-11-07 DIAGNOSIS — R5383 Other fatigue: Secondary | ICD-10-CM | POA: Diagnosis not present

## 2019-11-08 DIAGNOSIS — R82998 Other abnormal findings in urine: Secondary | ICD-10-CM | POA: Diagnosis not present

## 2019-11-14 DIAGNOSIS — R635 Abnormal weight gain: Secondary | ICD-10-CM | POA: Diagnosis not present

## 2019-11-14 DIAGNOSIS — F331 Major depressive disorder, recurrent, moderate: Secondary | ICD-10-CM | POA: Diagnosis not present

## 2019-11-14 DIAGNOSIS — F418 Other specified anxiety disorders: Secondary | ICD-10-CM | POA: Diagnosis not present

## 2019-11-14 DIAGNOSIS — Z1331 Encounter for screening for depression: Secondary | ICD-10-CM | POA: Diagnosis not present

## 2019-11-14 DIAGNOSIS — Z0000 Encounter for general adult medical examination without abnormal findings: Secondary | ICD-10-CM | POA: Diagnosis not present

## 2019-11-14 DIAGNOSIS — E559 Vitamin D deficiency, unspecified: Secondary | ICD-10-CM | POA: Diagnosis not present

## 2019-11-17 DIAGNOSIS — Z1212 Encounter for screening for malignant neoplasm of rectum: Secondary | ICD-10-CM | POA: Diagnosis not present

## 2020-04-19 DIAGNOSIS — F4321 Adjustment disorder with depressed mood: Secondary | ICD-10-CM | POA: Diagnosis not present

## 2020-04-19 DIAGNOSIS — F418 Other specified anxiety disorders: Secondary | ICD-10-CM | POA: Diagnosis not present

## 2020-04-25 DIAGNOSIS — F418 Other specified anxiety disorders: Secondary | ICD-10-CM | POA: Diagnosis not present

## 2020-04-25 DIAGNOSIS — F3289 Other specified depressive episodes: Secondary | ICD-10-CM | POA: Diagnosis not present

## 2020-05-15 DIAGNOSIS — R635 Abnormal weight gain: Secondary | ICD-10-CM | POA: Diagnosis not present

## 2020-05-15 DIAGNOSIS — L659 Nonscarring hair loss, unspecified: Secondary | ICD-10-CM | POA: Diagnosis not present

## 2020-05-15 DIAGNOSIS — N951 Menopausal and female climacteric states: Secondary | ICD-10-CM | POA: Diagnosis not present

## 2020-05-15 DIAGNOSIS — Z01419 Encounter for gynecological examination (general) (routine) without abnormal findings: Secondary | ICD-10-CM | POA: Diagnosis not present

## 2020-05-15 DIAGNOSIS — Z6825 Body mass index (BMI) 25.0-25.9, adult: Secondary | ICD-10-CM | POA: Diagnosis not present

## 2020-05-15 DIAGNOSIS — Z1151 Encounter for screening for human papillomavirus (HPV): Secondary | ICD-10-CM | POA: Diagnosis not present

## 2020-05-31 DIAGNOSIS — N951 Menopausal and female climacteric states: Secondary | ICD-10-CM | POA: Diagnosis not present

## 2020-08-05 DIAGNOSIS — Z1231 Encounter for screening mammogram for malignant neoplasm of breast: Secondary | ICD-10-CM | POA: Diagnosis not present

## 2020-08-07 DIAGNOSIS — Z1159 Encounter for screening for other viral diseases: Secondary | ICD-10-CM | POA: Diagnosis not present

## 2020-09-20 DIAGNOSIS — R42 Dizziness and giddiness: Secondary | ICD-10-CM | POA: Diagnosis not present

## 2020-09-20 DIAGNOSIS — R5383 Other fatigue: Secondary | ICD-10-CM | POA: Diagnosis not present

## 2020-11-22 DIAGNOSIS — E559 Vitamin D deficiency, unspecified: Secondary | ICD-10-CM | POA: Diagnosis not present

## 2020-11-22 DIAGNOSIS — Z0000 Encounter for general adult medical examination without abnormal findings: Secondary | ICD-10-CM | POA: Diagnosis not present

## 2020-11-29 DIAGNOSIS — Z1212 Encounter for screening for malignant neoplasm of rectum: Secondary | ICD-10-CM | POA: Diagnosis not present

## 2020-11-29 DIAGNOSIS — R82998 Other abnormal findings in urine: Secondary | ICD-10-CM | POA: Diagnosis not present

## 2020-11-29 DIAGNOSIS — Z1339 Encounter for screening examination for other mental health and behavioral disorders: Secondary | ICD-10-CM | POA: Diagnosis not present

## 2020-11-29 DIAGNOSIS — Z1331 Encounter for screening for depression: Secondary | ICD-10-CM | POA: Diagnosis not present

## 2020-11-29 DIAGNOSIS — Z0000 Encounter for general adult medical examination without abnormal findings: Secondary | ICD-10-CM | POA: Diagnosis not present

## 2020-11-29 DIAGNOSIS — E559 Vitamin D deficiency, unspecified: Secondary | ICD-10-CM | POA: Diagnosis not present

## 2021-08-06 DIAGNOSIS — Z1231 Encounter for screening mammogram for malignant neoplasm of breast: Secondary | ICD-10-CM | POA: Diagnosis not present

## 2021-11-26 DIAGNOSIS — R7989 Other specified abnormal findings of blood chemistry: Secondary | ICD-10-CM | POA: Diagnosis not present

## 2021-11-26 DIAGNOSIS — Z0000 Encounter for general adult medical examination without abnormal findings: Secondary | ICD-10-CM | POA: Diagnosis not present

## 2021-11-26 DIAGNOSIS — E559 Vitamin D deficiency, unspecified: Secondary | ICD-10-CM | POA: Diagnosis not present

## 2021-12-11 DIAGNOSIS — Z1339 Encounter for screening examination for other mental health and behavioral disorders: Secondary | ICD-10-CM | POA: Diagnosis not present

## 2021-12-11 DIAGNOSIS — Z0000 Encounter for general adult medical examination without abnormal findings: Secondary | ICD-10-CM | POA: Diagnosis not present

## 2021-12-11 DIAGNOSIS — Z1331 Encounter for screening for depression: Secondary | ICD-10-CM | POA: Diagnosis not present

## 2021-12-11 DIAGNOSIS — F331 Major depressive disorder, recurrent, moderate: Secondary | ICD-10-CM | POA: Diagnosis not present

## 2021-12-22 DIAGNOSIS — M5416 Radiculopathy, lumbar region: Secondary | ICD-10-CM | POA: Diagnosis not present

## 2021-12-29 DIAGNOSIS — M5442 Lumbago with sciatica, left side: Secondary | ICD-10-CM | POA: Diagnosis not present

## 2022-01-02 ENCOUNTER — Other Ambulatory Visit: Payer: Self-pay | Admitting: Family Medicine

## 2022-01-02 DIAGNOSIS — M5416 Radiculopathy, lumbar region: Secondary | ICD-10-CM | POA: Diagnosis not present

## 2022-01-03 ENCOUNTER — Ambulatory Visit
Admission: RE | Admit: 2022-01-03 | Discharge: 2022-01-03 | Disposition: A | Discharge status: Home / Self Care | Payer: BC Managed Care – PPO | Source: Ambulatory Visit | Attending: Family Medicine | Admitting: Family Medicine

## 2022-01-03 DIAGNOSIS — M5416 Radiculopathy, lumbar region: Secondary | ICD-10-CM

## 2022-01-03 DIAGNOSIS — M545 Low back pain, unspecified: Secondary | ICD-10-CM | POA: Diagnosis not present

## 2022-01-04 DIAGNOSIS — M5416 Radiculopathy, lumbar region: Secondary | ICD-10-CM | POA: Diagnosis not present

## 2022-01-04 DIAGNOSIS — M545 Low back pain, unspecified: Secondary | ICD-10-CM | POA: Diagnosis not present

## 2022-01-05 ENCOUNTER — Other Ambulatory Visit: Payer: Self-pay | Admitting: Family Medicine

## 2022-01-05 DIAGNOSIS — M5416 Radiculopathy, lumbar region: Secondary | ICD-10-CM

## 2022-01-14 DIAGNOSIS — M545 Low back pain: Secondary | ICD-10-CM | POA: Diagnosis not present

## 2022-01-16 DIAGNOSIS — M5442 Lumbago with sciatica, left side: Secondary | ICD-10-CM | POA: Diagnosis not present

## 2022-01-17 ENCOUNTER — Other Ambulatory Visit: Payer: Self-pay

## 2022-01-17 ENCOUNTER — Encounter (HOSPITAL_COMMUNITY): Payer: Self-pay | Admitting: Emergency Medicine

## 2022-01-17 ENCOUNTER — Emergency Department (HOSPITAL_COMMUNITY)
Admission: EM | Admit: 2022-01-17 | Discharge: 2022-01-17 | Disposition: A | Discharge status: Home / Self Care | Payer: BC Managed Care – PPO | Attending: Emergency Medicine | Admitting: Emergency Medicine

## 2022-01-17 DIAGNOSIS — M545 Low back pain, unspecified: Secondary | ICD-10-CM | POA: Diagnosis not present

## 2022-01-17 DIAGNOSIS — M5416 Radiculopathy, lumbar region: Secondary | ICD-10-CM | POA: Insufficient documentation

## 2022-01-17 HISTORY — DX: Dorsalgia, unspecified: M54.9

## 2022-01-17 LAB — CBG MONITORING, ED: Glucose-Capillary: 99 mg/dL (ref 70–99)

## 2022-01-17 MED ORDER — GABAPENTIN 300 MG PO CAPS: 300.0000 mg | ORAL_CAPSULE | Freq: Three times a day (TID) | ORAL | 0 refills | Status: DC | PRN

## 2022-01-17 MED ORDER — METHOCARBAMOL 500 MG PO TABS
500.0000 mg | ORAL_TABLET | Freq: Once | ORAL | Status: AC
Start: 2022-01-17 — End: 2022-01-17
  Administered 2022-01-17: 500 mg via ORAL
  Filled 2022-01-17: qty 1

## 2022-01-17 MED ORDER — KETOROLAC TROMETHAMINE 30 MG/ML IJ SOLN
30.0000 mg | Freq: Once | INTRAMUSCULAR | Status: AC
  Administered 2022-01-17: 30 mg via INTRAMUSCULAR
  Filled 2022-01-17: qty 1

## 2022-01-17 MED ORDER — LIDOCAINE 5 % EX PTCH
2.0000 | MEDICATED_PATCH | Freq: Once | CUTANEOUS | Status: DC
  Administered 2022-01-17: 2 via TRANSDERMAL
  Filled 2022-01-17: qty 2

## 2022-01-17 MED ORDER — GABAPENTIN 300 MG PO CAPS
300.0000 mg | ORAL_CAPSULE | Freq: Once | ORAL | Status: AC
  Administered 2022-01-17: 300 mg via ORAL
  Filled 2022-01-17: qty 1

## 2022-01-17 NOTE — ED Triage Notes (Signed)
C/o lower back pain x 4 weeks that now radiates down L leg.  States she had a MRI last week and reports 4 visits to [facility] for pain medication and/or steroids.

## 2022-01-17 NOTE — ED Provider Notes (Cosign Needed Addendum)
[facility]   CSN: [id number] Arrival date & time: 01/17/22  [id number]     History  Chief Complaint  Patient presents with   Back Pain    Madeline Wallace is a 51 y.o. female.  Madeline Wallace is a 51 y.o. female with hx of back pain presents to the ED for evaluation of on-going back pain. Pt reports pain initially started about 5 weeks ago. Pt has been seen multiple times by her PCP and urgent care for this pain, she has had a lumbar spine MRI, and has an upcoming appointment with neurosurgery on 6/1. Pt reports a history of multiple agents for pain control, she has gotten some relief with NSAIDs but little improvement with steroids, they have also trialed muscle relaxers.  Patient presents today due to ongoing pain, reports that she has been having difficulty sleeping due to the worsening pain in her low back that radiates down her left leg.  She denies any swelling in the leg, no numbness or weakness in the leg.  She occasionally has some tingling or paresthesias.  No difficulty controlling her bowels or bladder.  No associated abdominal pain.  No fevers or chills.  No prior history of cancer or IV drug use.  Patient has upcoming follow-up but reports that she just needs something to help improve the pain so that she can get some rest in the meantime.  The history is provided by the patient and the spouse.       Home Medications Prior to Admission medications   Medication Sig Start Date End Date Taking? Authorizing Provider  Cholecalciferol (VITAMIN D3) 2000 units capsule Take by mouth. 05/15/11   [provider]  EPINEPHrine 0.3 mg/0.3 mL IJ SOAJ injection Inject into the muscle as needed.  02/06/13   [provider]  glucosamine-chondroitin 500-400 MG tablet Take 1 tablet by mouth daily.    [provider]  hydrocortisone (PROCTOCORT) 1 % CREA Use as directed nightly as needed. 02/06/13   [provider]   loratadine (CLARITIN) 10 MG tablet Take by mouth.    [provider]  montelukast (SINGULAIR) 10 MG tablet Take by mouth. 02/06/13   [provider]  Multiple Vitamin (MULTIVITAMIN) tablet Take by mouth.    [provider]  Omega-3 Fatty Acids (FISH OIL PO) Take 1 tablet by mouth daily.    [provider]  ondansetron (ZOFRAN) 4 MG tablet Take 1 mg by mouth every 8 (eight) hours as needed for nausea or vomiting. 11/24/16   Lenn Sinkegal, Norman S, DPM  oxyCODONE-acetaminophen (PERCOCET) 10-325 MG tablet Take 1 tablet by mouth every 4 (four) hours as needed for pain. 11/24/16   Lenn Sinkegal, Norman S, DPM  Probiotic Product (PROBIOTIC DAILY PO) Take 1 tablet by mouth daily.    [provider]      Allergies    Phenergan [promethazine hcl] and Hydrocodone-acetaminophen    Review of Systems   Review of Systems  Constitutional:  Negative for chills and fever.  HENT: Negative.    Respiratory:  Negative for shortness of breath.   Cardiovascular:  Negative for chest pain.  Gastrointestinal:  Negative for abdominal pain, constipation, diarrhea, nausea and vomiting.  Genitourinary:  Negative for dysuria, flank pain, frequency and hematuria.  Musculoskeletal:  Positive for back pain. Negative for arthralgias, gait problem, joint swelling, myalgias and neck pain.  Skin:  Negative for color change, rash and wound.  Neurological:  Negative for weakness and numbness.  Physical Exam Updated Vital Signs BP 111/63   Pulse 83   Temp 97.8 F (36.6 C) (Oral)   Resp 18   LMP 01/09/2022   SpO2 97%  Physical Exam Vitals and nursing note reviewed.  Constitutional:      General: She is not in acute distress.    Appearance: Normal appearance. She is well-developed. She is not ill-appearing or diaphoretic.  HENT:     Head: Atraumatic.  Eyes:     General:        Right eye: No discharge.        Left eye: No discharge.  Cardiovascular:     Pulses:          Radial pulses  are 2+ on the right side and 2+ on the left side.       Dorsalis pedis pulses are 2+ on the right side and 2+ on the left side.       Posterior tibial pulses are 2+ on the right side and 2+ on the left side.  Pulmonary:     Effort: Pulmonary effort is normal. No respiratory distress.  Abdominal:     General: Bowel sounds are normal. There is no distension.     Palpations: Abdomen is soft. There is no mass.     Tenderness: There is no abdominal tenderness. There is no guarding.     Comments: Abdomen soft, nondistended, nontender to palpation in all quadrants without guarding or peritoneal signs, no CVA tenderness bilaterally  Musculoskeletal:     Cervical back: Neck supple.     Comments: Tenderness to palpation over left buttock and left low back.  Pain made worse with range of motion of the lower extremities, positive straight leg raise on the left. No focal bony tenderness or swelling throughout the lower extremity, no redness or skin changes.  Skin:    General: Skin is warm and dry.     Capillary Refill: Capillary refill takes less than 2 seconds.  Neurological:     Mental Status: She is alert and oriented to person, place, and time.     Comments: Alert, clear speech, following commands. Moving all extremities without difficulty. Bilateral lower extremities with 5/5 strength in proximal and distal muscle groups and with dorsi and plantar flexion. Sensation intact in bilateral lower extremities. 2+ patellar DTRs bilaterally. Ambulatory with steady gait  Psychiatric:        Behavior: Behavior normal.    ED Results / Procedures / Treatments   Labs (all labs ordered are listed, but only abnormal results are displayed) Labs Reviewed  CBG MONITORING, ED    EKG None  Radiology No results found.  Procedures Procedures    Medications Ordered in ED Medications  lidocaine (LIDODERM) 5 % 2 patch (2 patches Transdermal Patch Applied 01/17/22 0858)  ketorolac (TORADOL) 30 MG/ML  injection 30 mg (30 mg Intramuscular Given 01/17/22 0858)  methocarbamol (ROBAXIN) tablet 500 mg (500 mg Oral Given 01/17/22 0858)  gabapentin (NEURONTIN) capsule 300 mg (300 mg Oral Given 01/17/22 1478)    ED Course/ Medical Decision Making/ A&P                           Medical Decision Making Risk Prescription drug management.   Patient presents to the ED with complaints of  back pain.  Nontoxic, vitals WNL.   Additional history obtained:  Additional history obtained from chart review & nursing note review.  I reviewed notes from patient's  multiple PCP and urgent care visits, and I also reviewed imaging report from lumbar spine MRI from outside hospital system, unable to view images.  ED Course:  Patient has no back pain red flags.  No neurologic deficits, ambulatory- doubt cauda equina syndrome or acute cord compression. Afebrile, no hx of IVDU- doubt epidural abscess. No urinary sxs- feel that UTI/nephrolithiasis. Symmetric pulses, not a tearing sensation, overall well appearing, feel that dissection is unlikely at this time.  On MRI patient did have some mild degenerative changes at L5-S1 which could be resulting in her pain.  Treated in the ED with gabapentin in addition to Toradol and Robaxin and patient was finally able to get some rest.  Will discharge with prescription for gabapentin until she can follow-up with neurosurgery as planned on 6/1.  I discussed treatment plan, follow-up, and return precautions with the patient. Provided opportunity for questions, patient confirmed understanding and is in agreement with plan.   Portions of this note were generated with Scientist, clinical (histocompatibility and immunogenetics). Dictation errors may occur despite best attempts at proofreading.          Final Clinical Impression(s) / ED Diagnoses Final diagnoses:  Lumbar radiculopathy    Rx / DC Orders ED Discharge Orders          Ordered    gabapentin (NEURONTIN) 300 MG capsule  3 times daily PRN         01/17/22 1013              Dartha Lodge, [place] 01/31/22 1417    Dartha Lodge, PA-C 01/31/22 1418    Tanda Rockers A, DO 02/02/22 [phone]

## 2022-01-17 NOTE — ED Notes (Signed)
Resting , states she is feeling a little better.

## 2022-01-17 NOTE — Discharge Instructions (Signed)
Follow-up with neurosurgery on 6/1 as planned.  In the meantime to help manage your pain in addition to the NSAIDs and muscle relaxers you have been prescribed you can take gabapentin 3 times daily as needed, in particular suspect this will be helpful before bedtime.  This medication can in particular help with nerve pain.  This medication can cause some drowsiness.  Make sure you are taking your omeprazole at least 30 minutes prior to your first meal to help with stomach irritation from anti-inflammatories.  Then make sure you eat a full meal prior to taking your meloxicam for the day.  If you develop numbness, weakness, loss of bowel or bladder control, fevers or other new or concerning symptoms return to the emergency department.  Otherwise follow-up with your neurosurgeon and PCP as planned.

## 2022-01-22 DIAGNOSIS — M5126 Other intervertebral disc displacement, lumbar region: Secondary | ICD-10-CM | POA: Diagnosis not present

## 2022-01-22 DIAGNOSIS — Z6825 Body mass index (BMI) 25.0-25.9, adult: Secondary | ICD-10-CM | POA: Diagnosis not present

## 2022-01-27 DIAGNOSIS — M545 Low back pain: Secondary | ICD-10-CM | POA: Diagnosis not present

## 2022-02-10 DIAGNOSIS — M5416 Radiculopathy, lumbar region: Secondary | ICD-10-CM | POA: Diagnosis not present

## 2022-03-24 DIAGNOSIS — M5416 Radiculopathy, lumbar region: Secondary | ICD-10-CM | POA: Diagnosis not present

## 2022-03-30 DIAGNOSIS — M461 Sacroiliitis, not elsewhere classified: Secondary | ICD-10-CM | POA: Diagnosis not present

## 2022-03-31 DIAGNOSIS — R058 Other specified cough: Secondary | ICD-10-CM | POA: Diagnosis not present

## 2022-05-08 DIAGNOSIS — E559 Vitamin D deficiency, unspecified: Secondary | ICD-10-CM | POA: Diagnosis not present

## 2022-05-08 DIAGNOSIS — R5383 Other fatigue: Secondary | ICD-10-CM | POA: Diagnosis not present

## 2022-05-08 DIAGNOSIS — F331 Major depressive disorder, recurrent, moderate: Secondary | ICD-10-CM | POA: Diagnosis not present

## 2022-05-12 ENCOUNTER — Encounter: Payer: Self-pay | Admitting: Internal Medicine

## 2022-05-25 ENCOUNTER — Encounter: Payer: Self-pay | Admitting: Obstetrics and Gynecology

## 2022-05-25 NOTE — Progress Notes (Deleted)
51 y.o. No obstetric history on file. Married.Racefemale here for annual exam.    PCP:     No LMP recorded.           Sexually active: {yes no:314532}  The current method of family planning is {contraception:315051}.    Exercising: {yes no:314532}  {types:19826} Smoker:  {YES NO:22349}  Health Maintenance: Pap:  *** History of abnormal Pap:  {YES NO:22349} MMG:  *** Colonoscopy:  *** BMD:   ***  Result  *** TDaP:  *** Gardasil:   {YES NO:22349} HIV: Hep C: Screening Labs:  Hb today: ***, Urine today: ***   reports that she has never smoked. She has never used smokeless tobacco. She reports that she does not currently use alcohol. She reports that she does not use drugs.  Past Medical History:  Diagnosis Date   Back pain     Past Surgical History:  Procedure Laterality Date   BREAST CYST ASPIRATION      Current Outpatient Medications  Medication Sig Dispense Refill   Cholecalciferol (VITAMIN D3) 2000 units capsule Take by mouth.     EPINEPHrine 0.3 mg/0.3 mL IJ SOAJ injection Inject into the muscle as needed.      gabapentin (NEURONTIN) 300 MG capsule Take 1 capsule (300 mg total) by mouth 3 (three) times daily as needed. 30 capsule 0   glucosamine-chondroitin 500-400 MG tablet Take 1 tablet by mouth daily.     hydrocortisone (PROCTOCORT) 1 % CREA Use as directed nightly as needed.     loratadine (CLARITIN) 10 MG tablet Take by mouth.     montelukast (SINGULAIR) 10 MG tablet Take by mouth.     Multiple Vitamin (MULTIVITAMIN) tablet Take by mouth.     Omega-3 Fatty Acids (FISH OIL PO) Take 1 tablet by mouth daily.     ondansetron (ZOFRAN) 4 MG tablet Take 1 mg by mouth every 8 (eight) hours as needed for nausea or vomiting.     oxyCODONE-acetaminophen (PERCOCET) 10-325 MG tablet Take 1 tablet by mouth every 4 (four) hours as needed for pain.     Probiotic Product (PROBIOTIC DAILY PO) Take 1 tablet by mouth daily.     No current facility-administered medications for  this visit.    No family history on file.  Review of Systems  Exam:   There were no vitals taken for this visit.    General appearance: alert, cooperative and appears stated age Head: normocephalic, without obvious abnormality, atraumatic Neck: no adenopathy, supple, symmetrical, trachea midline and thyroid normal to inspection and palpation Lungs: clear to auscultation bilaterally Breasts: normal appearance, no masses or tenderness, No nipple retraction or dimpling, No nipple discharge or bleeding, No axillary adenopathy Heart: regular rate and rhythm Abdomen: soft, non-tender; no masses, no organomegaly Extremities: extremities normal, atraumatic, no cyanosis or edema Skin: skin color, texture, turgor normal. No rashes or lesions Lymph nodes: cervical, supraclavicular, and axillary nodes normal. Neurologic: grossly normal  Pelvic: External genitalia:  no lesions              No abnormal inguinal nodes palpated.              Urethra:  normal appearing urethra with no masses, tenderness or lesions              Bartholins and Skenes: normal                 Vagina: normal appearing vagina with normal color and discharge, no lesions  Cervix: no lesions              Pap taken: {yes no:314532} Bimanual Exam:  Uterus:  normal size, contour, position, consistency, mobility, non-tender              Adnexa: no mass, fullness, tenderness              Rectal exam: {yes no:314532}.  Confirms.              Anus:  normal sphincter tone, no lesions  Chaperone was present for exam:  ***  Assessment:   Well woman visit with gynecologic exam.   Plan: Mammogram screening discussed. Self breast awareness reviewed. Pap and HR HPV as above. Guidelines for Calcium, Vitamin D, regular exercise program including cardiovascular and weight bearing exercise.   Follow up annually and prn.   Additional counseling given.  {yes [id number]. _______ minutes face to face time of which over  50% was spent in counseling.    After visit summary provided.

## 2022-06-02 ENCOUNTER — Encounter: Payer: Self-pay | Admitting: Obstetrics and Gynecology

## 2022-06-02 ENCOUNTER — Ambulatory Visit: Payer: BC Managed Care – PPO | Admitting: Obstetrics and Gynecology

## 2022-06-02 ENCOUNTER — Telehealth: Payer: Self-pay | Admitting: Obstetrics and Gynecology

## 2022-06-02 VITALS — BP 106/64 | HR 78 | Resp 14 | Ht 63.5 in | Wt 147.0 lb

## 2022-06-02 DIAGNOSIS — E559 Vitamin D deficiency, unspecified: Secondary | ICD-10-CM | POA: Diagnosis not present

## 2022-06-02 DIAGNOSIS — R4586 Emotional lability: Secondary | ICD-10-CM | POA: Diagnosis not present

## 2022-06-02 DIAGNOSIS — N951 Menopausal and female climacteric states: Secondary | ICD-10-CM | POA: Diagnosis not present

## 2022-06-02 DIAGNOSIS — N6325 Unspecified lump in the left breast, overlapping quadrants: Secondary | ICD-10-CM | POA: Diagnosis not present

## 2022-06-02 DIAGNOSIS — N6320 Unspecified lump in the left breast, unspecified quadrant: Secondary | ICD-10-CM

## 2022-06-02 DIAGNOSIS — N6311 Unspecified lump in the right breast, upper outer quadrant: Secondary | ICD-10-CM | POA: Diagnosis not present

## 2022-06-02 DIAGNOSIS — R61 Generalized hyperhidrosis: Secondary | ICD-10-CM | POA: Diagnosis not present

## 2022-06-02 DIAGNOSIS — N6310 Unspecified lump in the right breast, unspecified quadrant: Secondary | ICD-10-CM

## 2022-06-02 NOTE — Telephone Encounter (Signed)
Please schedule a bilateral dx mammogram and bilateral breast US at the [facility] for my new patient.   She has a 1 cm right breast mass at 10:00 and she has a 1 cm left breast mass at 12:00.   She states history of breast cysts.  Her last mammogram was done at [place].  She had a diagnostic mammogram and left breast US at the [facility] in 2019.

## 2022-06-02 NOTE — Progress Notes (Addendum)
51 y.o. G1P1 Married [place] female here as new patient for annual exam.    Reporting menopausal symptoms.  She would like  natural approach.   Having hot flashes which are bothersome.  Having hot flashes day and night.  Started after her last menses.   Sleeping poorly.  Gets up to void.   Took gabapentin for sciatica.   Gabapentin 300 mg po tid.  This was affecting her concentration, so she is decreasing the usage.  Doing pilates and PT.   Having mood swings.  Preparing from her son who is going to go away likely for college.  Seeing a therapist from [place] to help with this transition.   Had Covid twice.   Has been treated with vit D.  Her level was was 30.  Her magnesium is a little elevated.   Her thyroid function was normal.   PCP:   Alysia Penna, MD  Patient's last menstrual period was 03/12/2022.           Sexually active: Yes.    The current method of family planning is vasectomy.    Exercising: Yes   pilates  Smoker:  no  Health Maintenance: Pap:  2022 at [facility], normal per patient  History of abnormal Pap:  no MMG:  1/23 at [facility], normal per patient  Colonoscopy:  scheduled 07/14/22 at [facility] BMD:   n/a  Result  n/a TDaP:  2013 Gardasil:   no HIV: 2001 Hep C: 2001  Screening Labs:  Hb today: PCP, Urine today: not collected   reports that she has never smoked. She has been exposed to tobacco smoke. She has never used smokeless tobacco. She reports that she does not currently use alcohol. She reports that she does not use drugs.  Past Medical History:  Diagnosis Date   Back pain     Past Surgical History:  Procedure Laterality Date   BREAST CYST ASPIRATION      Current Outpatient Medications  Medication Sig Dispense Refill   Cholecalciferol (VITAMIN D3) 2000 units capsule Take by mouth.     Cyanocobalamin (VITAMIN B-12 PO) Take by mouth.     gabapentin (NEURONTIN) 300 MG capsule Take 1 capsule (300 mg total) by mouth 3 (three)  times daily as needed. 30 capsule 0   hydrocortisone (PROCTOCORT) 1 % CREA Use as directed nightly as needed.     loratadine (CLARITIN) 10 MG tablet Take by mouth.     Omega-3 Fatty Acids (FISH OIL PO) Take 1 tablet by mouth daily.     ondansetron (ZOFRAN) 4 MG tablet Take 1 mg by mouth every 8 (eight) hours as needed for nausea or vomiting.     Probiotic Product (PROBIOTIC DAILY PO) Take 1 tablet by mouth daily.     No current facility-administered medications for this visit.    Family History  Problem Relation Age of Onset   Hyperlipidemia Mother    Hypertension Mother    Stroke Maternal Grandmother     Review of Systems  Constitutional:  Positive for fatigue.  Endocrine: Positive for heat intolerance.       Hot flashes  Skin:        Hair loss  Neurological: Negative.        Brain fog  Psychiatric/Behavioral:  Positive for dysphoric mood.     Exam:   BP 106/64   Pulse 78   Resp 14   Ht 5' 3.5" (1.613 m)   Wt 147 lb (66.7 kg)   LMP  03/12/2022   BMI 25.63 kg/m     General appearance: alert, cooperative and appears stated age Head: normocephalic, without obvious abnormality, atraumatic Neck: no adenopathy, supple, symmetrical, trachea midline and thyroid normal to inspection and palpation Lungs: clear to auscultation bilaterally Breasts: right - normal appearance, 1 cm mass at 10:00, No nipple retraction or dimpling, No nipple discharge or bleeding, No axillary adenopathy Left breast - normal appearance, 1 cm mass at 12:00, No nipple retraction or dimpling, No nipple discharge or bleeding, No axillary adenopathy Heart: regular rate and rhythm Abdomen: soft, non-tender; no masses, no organomegaly Extremities: extremities normal, atraumatic, no cyanosis or edema Skin: skin color, texture, turgor normal. No rashes or lesions Lymph nodes: cervical, supraclavicular, and axillary nodes normal. Neurologic: grossly normal  Pelvic: External genitalia:   7 mm brown flat nevus  of left labia majora and 5 mm left medial thigh subQ soft mass, consistent with skin cyst or lipoma.              No abnormal inguinal nodes palpated.              Urethra:  normal appearing urethra with no masses, tenderness or lesions              Bartholins and Skenes: normal                 Vagina: normal appearing vagina with normal color and discharge, no lesions              Cervix: no lesions              Pap taken: no Bimanual Exam:  Uterus:  normal size, contour, position, consistency, mobility, non-tender              Adnexa: no mass, fullness, tenderness      Chaperone was present for exam:  Raquel Sarna, RN  Assessment:   Menopausal symptoms.  Mood swings.  Bilateral breast masses.   Plan: Bilateral dx mammogram and bilateral breast US at [facility].  Self breast awareness reviewed. We discussed phases of menopause.  Brochure given.  We reviewed tx options:  HRT, Gabapentin, Paxil/Effexor, Veozah, herbal options.  Discused WHI and use of HRT which can increase risk of PE, DVT, MI, stroke and breast cancer.  She wishes to start HRT after her mammogram back and reassuring for start HRT.  We will plan for Vivelle Dot 0.05 mg twice weekly and Prometrium 100 mg nightly.  She will follow up 3 months after starting the HRT.  She will continue with her therapy sessions.   After visit summary provided.   55 min  total time was spent for this patient encounter, including preparation, face-to-face counseling with the patient, coordination of care, and documentation of the encounter.

## 2022-06-02 NOTE — Telephone Encounter (Signed)
I spoke with patient and let her know that [facility] will need to have her films from her previous mammogram prior to scheduling. She will contact [facility] to have those forwarded.  Orders placed.  She did say her last mammogram was January 2023.

## 2022-06-03 LAB — ESTRADIOL: Estradiol: <15 pg/mL

## 2022-06-03 LAB — FOLLICLE STIMULATING HORMONE: FSH: 84.9 m[IU]/mL

## 2022-06-03 NOTE — Telephone Encounter (Signed)
I called [facility]. They said they do not read films for [facility] and patient will have to speak with that office to arrange them be sent. Patient informed.

## 2022-06-03 NOTE — Telephone Encounter (Signed)
Patient called back to let me know she did speak with Darlyn Chamber and they told her that they will send films to [facility] within 48 hours.

## 2022-06-08 ENCOUNTER — Ambulatory Visit (AMBULATORY_SURGERY_CENTER): Payer: Self-pay

## 2022-06-08 VITALS — Ht 64.0 in | Wt 148.0 lb

## 2022-06-08 DIAGNOSIS — Z1211 Encounter for screening for malignant neoplasm of colon: Secondary | ICD-10-CM

## 2022-06-08 NOTE — Progress Notes (Signed)
No egg or soy allergy known to patient  No issues known to pt with past sedation with any surgeries or procedures Patient denies ever being told they had issues or difficulty with intubation  No FH of Malignant Hyperthermia Pt is not on diet pills Pt is not on  home 02  Pt is not on blood thinners  Pt denies issues with constipation  No A fib or A flutter Have any cardiac testing pending--no Pt instructed to use [email or web address] or GoodRx for a price reduction on prep   

## 2022-06-17 NOTE — Telephone Encounter (Signed)
Patient called back she is scheduled on 07/28/22 for imaging. She will call frequently to check for any cancellations.

## 2022-06-25 NOTE — Telephone Encounter (Signed)
Please offer my patient a dx bilateral mammogram and bilateral breast US at [facility], if they can get her in sooner than the [facility].

## 2022-06-26 NOTE — Telephone Encounter (Signed)
Pt reports she would like to see what [facility] earliest availability is. I spoke with Denyse Amass in scheduling at [facility] and said there earliest opening starts on 07/06/2022 and that they are pretty much wide open after that. Pt reports she would love to go to [place] but she needs to figure out about getting her previous reports/films sent to [place]. In meantime, will have order done and fax to [facility].

## 2022-07-02 DIAGNOSIS — R051 Acute cough: Secondary | ICD-10-CM | POA: Diagnosis not present

## 2022-07-07 ENCOUNTER — Encounter: Payer: Self-pay | Admitting: Gastroenterology

## 2022-07-07 ENCOUNTER — Encounter: Payer: Self-pay | Admitting: Internal Medicine

## 2022-07-08 ENCOUNTER — Encounter: Payer: BC Managed Care – PPO | Admitting: Internal Medicine

## 2022-07-13 NOTE — Telephone Encounter (Signed)
[facility] has appointment for [facility] for [facility]/22.   At this point, I will close this encounter.

## 2022-07-13 NOTE — Progress Notes (Unsigned)
[facility] History and Physical   Primary Care Physician:  Alysia Penna, MD   Reason for Procedure:  Colon cancer screening  Plan:    Colonoscopy     HPI: Madeline Wallace is a 51 y.o. female presenting for colon cancer screening by colonoscopy.   Past Medical History:  Diagnosis Date   Allergy    Anxiety    Back pain     Past Surgical History:  Procedure Laterality Date   BREAST CYST ASPIRATION      Prior to Admission medications   Medication Sig Start Date End Date Taking? Authorizing Provider  Cholecalciferol (VITAMIN D3) 2000 units capsule Take by mouth. 05/15/11   [provider]  Cyanocobalamin (VITAMIN B-12 PO) Take by mouth.    [provider]  gabapentin (NEURONTIN) 300 MG capsule Take 1 capsule (300 mg total) by mouth 3 (three) times daily as needed. Patient not taking: Reported on 06/08/2022 01/17/22   Dartha Lodge, PA-C  hydrocortisone (PROCTOCORT) 1 % CREA Use as directed nightly as needed. Patient not taking: Reported on 06/08/2022 02/06/13   [provider]  loratadine (CLARITIN) 10 MG tablet Take by mouth.    [provider]  Omega-3 Fatty Acids (FISH OIL PO) Take 1 tablet by mouth daily.    [provider]  ondansetron (ZOFRAN) 4 MG tablet Take 1 mg by mouth every 8 (eight) hours as needed for nausea or vomiting. Patient not taking: Reported on 06/08/2022 11/24/16   Lenn Sink, DPM  Probiotic Product (PROBIOTIC DAILY PO) Take 1 tablet by mouth daily.    [provider]    Current Outpatient Medications  Medication Sig Dispense Refill   Cholecalciferol (VITAMIN D3) 2000 units capsule Take by mouth.     Cyanocobalamin (VITAMIN B-12 PO) Take by mouth.     gabapentin (NEURONTIN) 300 MG capsule Take 1 capsule (300 mg total) by mouth 3 (three) times daily as needed. (Patient not taking: Reported on 06/08/2022) 30 capsule 0   hydrocortisone (PROCTOCORT) 1 % CREA Use as directed nightly as  needed. (Patient not taking: Reported on 06/08/2022)     loratadine (CLARITIN) 10 MG tablet Take by mouth.     Omega-3 Fatty Acids (FISH OIL PO) Take 1 tablet by mouth daily.     ondansetron (ZOFRAN) 4 MG tablet Take 1 mg by mouth every 8 (eight) hours as needed for nausea or vomiting. (Patient not taking: Reported on 06/08/2022)     Probiotic Product (PROBIOTIC DAILY PO) Take 1 tablet by mouth daily.     No current facility-administered medications for this visit.    Allergies as of 07/14/2022 - Review Complete 06/08/2022  Allergen Reaction Noted   Phenergan [promethazine hcl]  12/14/2016   Hydrocodone-acetaminophen Itching, Nausea And Vomiting, and Rash 12/14/2016    Family History  Problem Relation Age of Onset   Colon polyps Mother    Hyperlipidemia Mother    Hypertension Mother    Esophageal cancer Paternal Uncle    Stroke Maternal Grandmother    Stomach cancer Neg Hx    Rectal cancer Neg Hx     Social History   Socioeconomic History   Marital status: Married    Spouse name: Not on file   Number of children: Not on file   Years of education: Not on file   Highest education level: Not on file  Occupational History   Not on file  Tobacco Use   Smoking status: Never    Passive exposure: Past (  father smoked)   Smokeless tobacco: Never  Vaping Use   Vaping Use: Never used  Substance and Sexual Activity   Alcohol use: Not Currently   Drug use: Never   Sexual activity: Yes    Birth control/protection: Surgical    Comment: husband vasectomy, first intercourse- 22, partners- 2  Other Topics Concern   Not on file  Social History Narrative   Not on file   Social Determinants of Health   Financial Resource Strain: Not on file  Food Insecurity: Not on file  Transportation Needs: Not on file  Physical Activity: Not on file  Stress: Not on file  Social Connections: Not on file  Intimate Partner Violence: Not on file    Review of Systems: Positive for *** All  other review of systems negative except as mentioned in the HPI.  Physical Exam: Vital signs There were no vitals taken for this visit.  General:   Alert,  Well-developed, well-nourished, pleasant and cooperative in NAD Lungs:  Clear throughout to auscultation.   Heart:  Regular rate and rhythm; no murmurs, clicks, rubs,  or gallops. Abdomen:  Soft, nontender and nondistended. Normal bowel sounds.   Neuro/Psych:  Alert and cooperative. Normal mood and affect. A and O x 3   @Laneice Meneely  , MD, [facility] [phone] (pager) 07/13/2022 6:08 PM@

## 2022-07-14 ENCOUNTER — Encounter: Payer: Self-pay | Admitting: Internal Medicine

## 2022-07-14 ENCOUNTER — Ambulatory Visit (AMBULATORY_SURGERY_CENTER): Payer: BC Managed Care – PPO | Admitting: Internal Medicine

## 2022-07-14 VITALS — BP 107/71 | HR 66 | Temp 96.9°F | Resp 12 | Ht 64.0 in | Wt 148.0 lb

## 2022-07-14 DIAGNOSIS — Z1211 Encounter for screening for malignant neoplasm of colon: Secondary | ICD-10-CM | POA: Diagnosis not present

## 2022-07-14 MED ORDER — SODIUM CHLORIDE 0.9 % IV SOLN: 500.0000 mL | Freq: Once | INTRAVENOUS | Status: DC

## 2022-07-14 NOTE — Progress Notes (Signed)
Report to pacu rn. Vss. Care resumed by rn. 

## 2022-07-14 NOTE — Patient Instructions (Addendum)
Your colonoscopy was normal. No polyps or cancer were seen.  Next routine colonoscopy or other screening test in 10 years - 2033.  Please resume your regular medications and diet.  I appreciate the opportunity to care for you. Iva Boop, MD, FACG   YOU HAD AN ENDOSCOPIC PROCEDURE TODAY AT THE [facility]:   Refer to the procedure report that was given to you for any specific questions about what was found during the examination.  If the procedure report does not answer your questions, please call your gastroenterologist to clarify.  If you requested that your care partner not be given the details of your procedure findings, then the procedure report has been included in a sealed envelope for you to review at your convenience later.  YOU SHOULD EXPECT: Some feelings of bloating in the abdomen. Passage of more gas than usual.  Walking can help get rid of the air that was put into your GI tract during the procedure and reduce the bloating. If you had a lower endoscopy (such as a colonoscopy or flexible sigmoidoscopy) you may notice spotting of blood in your stool or on the toilet paper. If you underwent a bowel prep for your procedure, you may not have a normal bowel movement for a few days.  Please Note:  You might notice some irritation and congestion in your nose or some drainage.  This is from the oxygen used during your procedure.  There is no need for concern and it should clear up in a day or so.  SYMPTOMS TO REPORT IMMEDIATELY:  Following lower endoscopy (colonoscopy or flexible sigmoidoscopy):  Excessive amounts of blood in the stool  Significant tenderness or worsening of abdominal pains  Swelling of the abdomen that is new, acute  Fever of 100F or higher   For urgent or emergent issues, a gastroenterologist can be reached at any hour by calling [phone]. Do not use MyChart messaging for urgent concerns.    DIET:  We do recommend a small meal at first,  but then you may proceed to your regular diet.  Drink plenty of fluids but you should avoid alcoholic beverages for 24 hours.  ACTIVITY:  You should plan to take it easy for the rest of today and you should NOT DRIVE or use heavy machinery until tomorrow (because of the sedation medicines used during the test).    FOLLOW UP: Our staff will call the number listed on your records the next business day following your procedure.  We will call around 7:15- 8:00 am to check on you and address any questions or concerns that you may have regarding the information given to you following your procedure. If we do not reach you, we will leave a message.     If any biopsies were taken you will be contacted by phone or by letter within the next 1-3 weeks.  Please call us at [phone] if you have not heard about the biopsies in 3 weeks.    SIGNATURES/CONFIDENTIALITY: You and/or your care partner have signed paperwork which will be entered into your electronic medical record.  These signatures attest to the fact that that the information above on your After Visit Summary has been reviewed and is understood.  Full responsibility of the confidentiality of this discharge information lies with you and/or your care-partner.

## 2022-07-14 NOTE — Progress Notes (Signed)
Pt's states no medical or surgical changes since previsit or office visit. VS assessed by D.T 

## 2022-07-14 NOTE — Op Note (Signed)
[facility] Patient Name: Madeline Wallace Procedure Date: 07/14/2022 2:07 PM MRN: [id number] Endoscopist: Iva Boop , MD, [id number] Age: 51 Referring MD:  Date of Birth: 02-May-1971 Gender: Female Account #: [id number] Procedure:                Colonoscopy Indications:              Screening for colorectal malignant neoplasm, This                            is the patient's first colonoscopy Medicines:                Monitored Anesthesia Care Procedure:                Pre-Anesthesia Assessment:                           - Prior to the procedure, a History and Physical                            was performed, and patient medications and                            allergies were reviewed. The patient's tolerance of                            previous anesthesia was also reviewed. The risks                            and benefits of the procedure and the sedation                            options and risks were discussed with the patient.                            All questions were answered, and informed consent                            was obtained. Prior Anticoagulants: The patient has                            taken no anticoagulant or antiplatelet agents. ASA                            Grade Assessment: II - A patient with mild systemic                            disease. After reviewing the risks and benefits,                            the patient was deemed in satisfactory condition to                            undergo the procedure.  After obtaining informed consent, the colonoscope                            was passed under direct vision. Throughout the                            procedure, the patient's blood pressure, pulse, and                            oxygen saturations were monitored continuously. The                            Olympus PCF-H190DL ([id number]) Colonoscope was                            introduced through the  anus and advanced to the the                            cecum, identified by appendiceal orifice and                            ileocecal valve. The colonoscopy was performed                            without difficulty. The patient tolerated the                            procedure well. The quality of the bowel                            preparation was good. The bowel preparation used                            was Miralax via split dose instruction. Scope In: 2:22:53 PM Scope Out: 2:39:42 PM Scope Withdrawal Time: 0 hours 11 minutes 34 seconds  Total Procedure Duration: 0 hours 16 minutes 49 seconds  Findings:                 The perianal and digital rectal examinations were                            normal.                           The entire examined colon appeared normal on direct                            and retroflexion views. Complications:            No immediate complications. Estimated Blood Loss:     Estimated blood loss: none. Impression:               - The entire examined colon is normal on direct and                            retroflexion views.                           -  No specimens collected. Recommendation:           - Patient has a contact number available for                            emergencies. The signs and symptoms of potential                            delayed complications were discussed with the                            patient. Return to normal activities tomorrow.                            Written discharge instructions were provided to the                            patient.                           - Resume previous diet.                           - Continue present medications.                           - Repeat colonoscopy in 10 years for screening                            purposes. Iva Boop, MD 07/14/2022 2:45:56 PM This report has been signed electronically.

## 2022-07-15 ENCOUNTER — Telehealth: Payer: Self-pay

## 2022-07-15 NOTE — Telephone Encounter (Signed)
  Follow up Call-     07/14/2022    1:15 PM  Call back number  Post procedure Call Back phone  # [phone]  Permission to leave phone message Yes     Post op call attempted, no answer, left WM.

## 2022-07-28 ENCOUNTER — Ambulatory Visit
Admission: RE | Admit: 2022-07-28 | Discharge: 2022-07-28 | Disposition: A | Discharge status: Home / Self Care | Payer: BC Managed Care – PPO | Source: Ambulatory Visit | Attending: Obstetrics and Gynecology | Admitting: Obstetrics and Gynecology

## 2022-07-28 DIAGNOSIS — N6310 Unspecified lump in the right breast, unspecified quadrant: Secondary | ICD-10-CM

## 2022-07-28 DIAGNOSIS — N6012 Diffuse cystic mastopathy of left breast: Secondary | ICD-10-CM | POA: Diagnosis not present

## 2022-07-28 DIAGNOSIS — N6011 Diffuse cystic mastopathy of right breast: Secondary | ICD-10-CM | POA: Diagnosis not present

## 2022-07-28 DIAGNOSIS — N6311 Unspecified lump in the right breast, upper outer quadrant: Secondary | ICD-10-CM | POA: Diagnosis not present

## 2022-07-28 DIAGNOSIS — N6320 Unspecified lump in the left breast, unspecified quadrant: Secondary | ICD-10-CM

## 2022-07-28 DIAGNOSIS — N6315 Unspecified lump in the right breast, overlapping quadrants: Secondary | ICD-10-CM | POA: Diagnosis not present

## 2022-07-31 ENCOUNTER — Telehealth: Payer: Self-pay | Admitting: *Deleted

## 2022-07-31 MED ORDER — PROGESTERONE MICRONIZED 100 MG PO CAPS: 100.0000 mg | ORAL_CAPSULE | Freq: Every day | ORAL | 0 refills | Status: DC

## 2022-07-31 MED ORDER — ESTRADIOL 0.05 MG/24HR TD PTTW: 1.0000 | MEDICATED_PATCH | TRANSDERMAL | 0 refills | Status: DC

## 2022-07-31 NOTE — Telephone Encounter (Signed)
Patient scheduled on 10/07/22

## 2022-07-31 NOTE — Telephone Encounter (Signed)
Patient informed. Rx sent.   Message sent to appointments to schedule.

## 2022-07-31 NOTE — Telephone Encounter (Signed)
Patient called she has diag mammogram and breast ultrasound done on 07/28/22 at the breast center. Per note 06/02/22 " She wishes to start HRT after her mammogram back and reassuring for start HRT.  We will plan for Vivelle Dot 0.05 mg twice weekly and Prometrium 100 mg nightly.  She will follow up 3 months after starting the HRT."   Okay to send Rx?

## 2022-07-31 NOTE — Telephone Encounter (Signed)
Encounter reviewed and closed.  

## 2022-07-31 NOTE — Telephone Encounter (Signed)
Ok to start Vivelle Dot 0.05 mg twice weekly.  #24, RF none  AND Prometrium 100 mg po q hs.  #90, RF none.  Please make a recheck appointment with me for 10 weeks from now, so I can see her before the prescription runs out.

## 2022-09-07 ENCOUNTER — Telehealth: Payer: Self-pay

## 2022-09-07 NOTE — Telephone Encounter (Addendum)
Patient started on HRT after her breast studies were all final in early December.  She said that after taking if for 2-3 weeks, after Christmas, she started to have very light bleeding that continued daily through 08/24/22.   Now this week it has started again. She reports it is very light but a little more than the last time she bled in December.  She is taking Prometrium daily and the patch twice weekly.  Patient did report that her hotflashes have stopped. Still sweaty some but so much better.  Also, patient said after a week of using the patch she noticed when she removes it skin is very red and rashy and itchy.  Has happened every time with using it since.

## 2022-09-08 NOTE — Telephone Encounter (Signed)
Ok to switch from the transdermal estrogen to oral estradiol 1 mg daily and continue Prometrium 100 mg daily.  This will help to avoid the skin irritation from the patch.   Please have her keep a bleeding calendar so we can re-evaluate at her office visit in February.

## 2022-09-09 MED ORDER — ESTRADIOL 1 MG PO TABS: 1.0000 mg | ORAL_TABLET | Freq: Every day | ORAL | 2 refills | Status: DC

## 2022-09-09 NOTE — Telephone Encounter (Signed)
Spoke with patient, advised per Dr. Quincy Simmonds.  Estradiol Rx sent to verified pharmacy #[id number], per patient request.   Questions answered.  Patient verbalizes understanding and is agreeable.   Routing to provider for final review. Patient is agreeable to disposition. Will close encounter.

## 2022-09-21 DIAGNOSIS — H524 Presbyopia: Secondary | ICD-10-CM | POA: Diagnosis not present

## 2022-09-21 DIAGNOSIS — H2513 Age-related nuclear cataract, bilateral: Secondary | ICD-10-CM | POA: Diagnosis not present

## 2022-09-21 DIAGNOSIS — H02834 Dermatochalasis of left upper eyelid: Secondary | ICD-10-CM | POA: Diagnosis not present

## 2022-09-21 DIAGNOSIS — H02831 Dermatochalasis of right upper eyelid: Secondary | ICD-10-CM | POA: Diagnosis not present

## 2022-09-24 NOTE — Progress Notes (Signed)
GYNECOLOGY  VISIT   HPI: 52 y.o.   Married  [place]  female   G1P1 with Patient's last menstrual period was 09/18/2022.   here for  follow up of Rx for HRT.  Pt just wants to discuss.  She is taking Estradiol 1 mg and Prometrium 100 mg nightly. She had irritation of the skin from transdermal estrogen usage, so she was switched to oral estrogen.  Had a period Dec. 27 - normal. Menses again Jan. 26 - light.  Hot flashes are diminishing but still occurring.   The patch worked better to control hot flashes.   Mood is improving.   Less tired now.  Sleeping better with Melatonin usage.  Dealing with sciatica. Has a prescription for gabapentin to use as needed.  Asking about testosterone therapy.  She has decreased libido.  Doing nutrition counseling and psychology counseling on line.   GYNECOLOGIC HISTORY: Patient's last menstrual period was 09/18/2022. Contraception: vasectomy Menopausal hormone therapy:  estrace Last mammogram:  07/28/22 Breast Density Category D, BI-RADS CATEGORY 2 Benign bilateral breast cysts.  Last pap smear:   2022 at [employer] [facility], normal per patient        OB History     Gravida  1   Para  1   Term      Preterm      AB      Living         SAB      IAB      Ectopic      Multiple      Live Births                 Patient Active Problem List   Diagnosis Date Noted   Allergic rhinitis 12/24/2011   Fibrocystic breast 12/24/2011   Vitamin D deficiency 05/15/2011   Migraine headache 05/14/2011    Past Medical History:  Diagnosis Date   Allergy    Anxiety    Back pain     Past Surgical History:  Procedure Laterality Date   BREAST CYST ASPIRATION      Current Outpatient Medications  Medication Sig Dispense Refill   Cholecalciferol (VITAMIN D3) 2000 units capsule Take by mouth.     Cyanocobalamin (VITAMIN B-12 PO) Take by mouth.     estradiol (ESTRACE) 1 MG tablet Take 1 tablet (1 mg total) by mouth daily. 30  tablet 2   gabapentin (NEURONTIN) 100 MG capsule Take 100 mg by mouth daily.     loratadine (CLARITIN) 10 MG tablet Take by mouth.     Omega-3 Fatty Acids (FISH OIL PO) Take 1 tablet by mouth daily.     Probiotic Product (PROBIOTIC DAILY PO) Take 1 tablet by mouth daily.     progesterone (PROMETRIUM) 100 MG capsule Take 1 capsule (100 mg total) by mouth at bedtime. 90 capsule 0   No current facility-administered medications for this visit.     ALLERGIES: Phenergan [promethazine hcl] and Hydrocodone-acetaminophen  Family History  Problem Relation Age of Onset   Colon polyps Mother    Hyperlipidemia Mother    Hypertension Mother    Esophageal cancer Paternal Uncle    Stroke Maternal Grandmother    Stomach cancer Neg Hx    Rectal cancer Neg Hx     Social History   Socioeconomic History   Marital status: Married    Spouse name: Not on file   Number of children: Not on file   Years of education: Not on file  Highest education level: Not on file  Occupational History   Not on file  Tobacco Use   Smoking status: Never    Passive exposure: Past (father smoked)   Smokeless tobacco: Never  Vaping Use   Vaping Use: Never used  Substance and Sexual Activity   Alcohol use: Not Currently   Drug use: Never   Sexual activity: Yes    Birth control/protection: Surgical    Comment: husband vasectomy, first intercourse- 22, partners- 2  Other Topics Concern   Not on file  Social History Narrative   Not on file   Social Determinants of Health   Financial Resource Strain: Not on file  Food Insecurity: Not on file  Transportation Needs: Not on file  Physical Activity: Not on file  Stress: Not on file  Social Connections: Not on file  Intimate Partner Violence: Not on file    Review of Systems  All other systems reviewed and are negative.   PHYSICAL EXAMINATION:    BP 122/80 (BP Location: Right Arm, Patient Position: Sitting, Cuff Size: Normal)   Pulse 73   Ht 5' 3.5"  (1.613 m)   Wt 147 lb (66.7 kg)   LMP 09/18/2022   SpO2 95%   BMI 25.63 kg/m     General appearance: alert, cooperative and appears stated age  ASSESSMENT  HRT.  Decreased libido.  Vaginal bleeding.  I suspect resumption of menses as patient has not gone for one full calendar year without a cycle.   PLAN  Will check FSH, estradiol, and testosterone level.  We discussed testosterone treatment, which is available but not FDA approved for decreased libido.  Potential side effects discussed.  Patient would like to try low dose testosterone treatment after her labs are back.  Refill of estradiol and Prometrium after labs are back.  Will continue current dosages, and she will start taking the estradiol in the mornings. Fu in 2 months, sooner as needed.  An After Visit Summary was printed and given to the patient.  30 min  total time was spent for this patient encounter, including preparation, face-to-face counseling with the patient, coordination of care, and documentation of the encounter.

## 2022-10-01 DIAGNOSIS — M461 Sacroiliitis, not elsewhere classified: Secondary | ICD-10-CM | POA: Diagnosis not present

## 2022-10-01 DIAGNOSIS — Z6825 Body mass index (BMI) 25.0-25.9, adult: Secondary | ICD-10-CM | POA: Diagnosis not present

## 2022-10-01 DIAGNOSIS — M5126 Other intervertebral disc displacement, lumbar region: Secondary | ICD-10-CM | POA: Diagnosis not present

## 2022-10-07 ENCOUNTER — Ambulatory Visit: Payer: BC Managed Care – PPO | Admitting: Obstetrics and Gynecology

## 2022-10-07 ENCOUNTER — Encounter: Payer: Self-pay | Admitting: Obstetrics and Gynecology

## 2022-10-07 VITALS — BP 122/80 | HR 73 | Ht 63.5 in | Wt 147.0 lb

## 2022-10-07 DIAGNOSIS — N939 Abnormal uterine and vaginal bleeding, unspecified: Secondary | ICD-10-CM

## 2022-10-07 DIAGNOSIS — R6882 Decreased libido: Secondary | ICD-10-CM

## 2022-10-07 DIAGNOSIS — Z79890 Hormone replacement therapy (postmenopausal): Secondary | ICD-10-CM | POA: Diagnosis not present

## 2022-10-11 LAB — TESTOS,TOTAL,FREE AND SHBG (FEMALE)
Free Testosterone: 0.7 pg/mL (ref 0.1–6.4)
Sex Hormone Binding: 97.8 nmol/L (ref 17–124)
Testosterone, Total, LC-MS-MS: 14 ng/dL (ref 2–45)

## 2022-10-11 LAB — FOLLICLE STIMULATING HORMONE: FSH: 61.6 m[IU]/mL

## 2022-10-11 LAB — ESTRADIOL: Estradiol: 34 pg/mL

## 2022-10-12 ENCOUNTER — Other Ambulatory Visit: Payer: Self-pay | Admitting: Obstetrics and Gynecology

## 2022-10-12 DIAGNOSIS — R6882 Decreased libido: Secondary | ICD-10-CM

## 2022-10-13 ENCOUNTER — Telehealth: Payer: Self-pay

## 2022-10-13 ENCOUNTER — Encounter: Payer: Self-pay | Admitting: Obstetrics and Gynecology

## 2022-10-13 ENCOUNTER — Other Ambulatory Visit: Payer: Self-pay

## 2022-10-13 MED ORDER — NONFORMULARY OR COMPOUNDED ITEM: 0 refills | Status: DC

## 2022-10-13 NOTE — Telephone Encounter (Signed)
Patient was informed of all result note info.  She had questions relayed below.   In regards to "Her estradiol level is normal with her hormone replacement".  She asked normal for what?  Was this level compared to before HRT?  2.  In regards to "Her testosterone level is normal."  Patient wants to know how normal is determined. Normal for age 52? Normal for before menopause?

## 2022-10-13 NOTE — Telephone Encounter (Signed)
Spoke with patient and read her Dr. Elza Rafter message. Patient said it helped her understand greatly.  She is going to sign up for My Chart. I sent her a code via text and once she messages me I will copy and paste Dr. Elza Rafter reply so she will have it to review as well as she will be able see her results.

## 2022-10-13 NOTE — Telephone Encounter (Signed)
Patient's estrogen level was < 15 prior to her starting her therapy.  Her estrogen level is now 34, which is in a nonmenopausal range.  It is important to recognize that the actual number is not as important as controlling her menopausal symptoms. The amount of estrogen used and the number achieved that successfully treats menopause can be different for every woman.  Usually estrogen levels are not continually checked with hormone therapy.   [place] levels would not be expected to change to nonmenopausal range with her hormone treatment.   Testosterone levels are reported as normal, low, or high for women. They are not subdivided based on age.  A woman's libido can be low even if her testosterone level is normal.  Her testosterone level is a baseline check that we will compare to when she has her follow up testing.  The goal is to keep her in a normal female menopausal range and increase her libido.   I hope this helps to clarify.

## 2022-10-13 NOTE — Telephone Encounter (Signed)
-----   Message from Nunzio Cobbs, MD sent at 10/12/2022  8:37 AM EST ----- Please contact patient with results of hormone testing.  Her [place] confirms menopause.  Her estradiol level is normal with her hormone replacement therapy.  Her testosterone level is normal.   We discussed testosterone therapy for low libido, and she wants to start low dosage of this.  Rx:  Testosterone cream 0.1%.  Apply 0.5 gram to skin of arm, leg, or abdomen three days per week.  Rotate site. Disp:  60 grams RF:  none. Rx to either [facility] or [facility].   Please schedule a follow up visit and lab testosterone check with me for 6 weeks.

## 2022-10-13 NOTE — Telephone Encounter (Signed)
Thank you for assisting our patient!

## 2022-10-30 ENCOUNTER — Other Ambulatory Visit: Payer: Self-pay | Admitting: Obstetrics and Gynecology

## 2022-10-30 DIAGNOSIS — J40 Bronchitis, not specified as acute or chronic: Secondary | ICD-10-CM | POA: Diagnosis not present

## 2022-10-30 DIAGNOSIS — Z79890 Hormone replacement therapy (postmenopausal): Secondary | ICD-10-CM

## 2022-10-30 NOTE — Telephone Encounter (Signed)
Pt returned call and confirmed that estrogen is not needed at this time. Will refuse refill request and send progesterone Rx to be authorized by provider.

## 2022-10-30 NOTE — Telephone Encounter (Signed)
Pt calling to report that she needs refills on her HRTs progesterone and estradiol. Per my research, pt may need refill on progesterone due to #90 w/ 0rfs sent on 07/31/2022. However, estradiol tabs #30 w/ 2 rfs were sent on 09/09/2022.   I contacted [facility] on file to see if pt still had refills on file for estradiol and it was confirmed by Apolonio Schneiders that pt picked up the #90 tabs all together on 09/09/2022 and rx was closed out at that time. Pt should not need refills on that at the moment until after f/u appt in April if taking correctly. I will contact pt to confirm.   Last AEX 06/02/2022--recall placed for AEX for 2024. Pt has appt for f/u OV on 11/24/2022. Last mammo 07/28/2022--birads 2 benign.    LVMTCB.

## 2022-10-31 ENCOUNTER — Other Ambulatory Visit: Payer: Self-pay | Admitting: Obstetrics and Gynecology

## 2022-11-01 MED ORDER — PROGESTERONE MICRONIZED 100 MG PO CAPS: 100.0000 mg | ORAL_CAPSULE | Freq: Every day | ORAL | 0 refills | Status: DC

## 2022-11-02 NOTE — Telephone Encounter (Signed)
Rx signed yesterday for #90 w/ 0 refills. Will refuse this rx and phone pharmacy to make sure they received refill.   Pharmacy confirmed they received refill.  Pt LVM stating she needed refill. Benjamine Mola w/ [facility] confirmed that pharmacy has refill and it is ready for her to pick up. LDVM on machine per DPR that rx   Will forward to provider for final review and close.

## 2022-11-10 NOTE — Progress Notes (Deleted)
GYNECOLOGY  VISIT   HPI: 52 y.o.   Married  {Race/ethnicity:17218}  female   G1P1 with No LMP recorded. Patient is perimenopausal.   here for   f/u and labs  GYNECOLOGIC HISTORY: No LMP recorded. Patient is perimenopausal. Contraception:  perimenopause Menopausal hormone therapy:  estrace Last mammogram:  07/28/22 Breast Density Category D, BI-RADS CATEGORY 2 benign Last pap smear:   2022 at [facility], normal per patient               OB History     Gravida  1   Para  1   Term      Preterm      AB      Living         SAB      IAB      Ectopic      Multiple      Live Births                 Patient Active Problem List   Diagnosis Date Noted   Allergic rhinitis 12/24/2011   Fibrocystic breast 12/24/2011   Vitamin D deficiency 05/15/2011   Migraine headache 05/14/2011    Past Medical History:  Diagnosis Date   Allergy    Anxiety    Back pain     Past Surgical History:  Procedure Laterality Date   BREAST CYST ASPIRATION      Current Outpatient Medications  Medication Sig Dispense Refill   NONFORMULARY OR COMPOUNDED ITEM Rx:  Testosterone cream 0.1%.   S: Apply 0.5 gram to skin of arm, leg, or abdomen three days per week.  Rotate site.  Disp:  60 grams  RF:  none. 60 each 0   Cholecalciferol (VITAMIN D3) 2000 units capsule Take by mouth.     Cyanocobalamin (VITAMIN B-12 PO) Take by mouth.     estradiol (ESTRACE) 1 MG tablet Take 1 tablet (1 mg total) by mouth daily. 30 tablet 2   gabapentin (NEURONTIN) 100 MG capsule Take 100 mg by mouth daily.     loratadine (CLARITIN) 10 MG tablet Take by mouth.     Omega-3 Fatty Acids (FISH OIL PO) Take 1 tablet by mouth daily.     Probiotic Product (PROBIOTIC DAILY PO) Take 1 tablet by mouth daily.     progesterone (PROMETRIUM) 100 MG capsule Take 1 capsule (100 mg total) by mouth at bedtime. 90 capsule 0   No current facility-administered medications for this visit.     ALLERGIES: Phenergan  [promethazine hcl] and Hydrocodone-acetaminophen  Family History  Problem Relation Age of Onset   Colon polyps Mother    Hyperlipidemia Mother    Hypertension Mother    Esophageal cancer Paternal Uncle    Stroke Maternal Grandmother    Stomach cancer Neg Hx    Rectal cancer Neg Hx     Social History   Socioeconomic History   Marital status: Married    Spouse name: Not on file   Number of children: Not on file   Years of education: Not on file   Highest education level: Not on file  Occupational History   Not on file  Tobacco Use   Smoking status: Never    Passive exposure: Past (father smoked)   Smokeless tobacco: Never  Vaping Use   Vaping Use: Never used  Substance and Sexual Activity   Alcohol use: Not Currently   Drug use: Never   Sexual activity: Yes    Birth control/protection:  Surgical    Comment: husband vasectomy, first intercourse- 22, partners- 2  Other Topics Concern   Not on file  Social History Narrative   Not on file   Social Determinants of Health   Financial Resource Strain: Not on file  Food Insecurity: Not on file  Transportation Needs: Not on file  Physical Activity: Not on file  Stress: Not on file  Social Connections: Not on file  Intimate Partner Violence: Not on file    Review of Systems  PHYSICAL EXAMINATION:    There were no vitals taken for this visit.    General appearance: alert, cooperative and appears stated age Head: Normocephalic, without obvious abnormality, atraumatic Neck: no adenopathy, supple, symmetrical, trachea midline and thyroid normal to inspection and palpation Lungs: clear to auscultation bilaterally Breasts: normal appearance, no masses or tenderness, No nipple retraction or dimpling, No nipple discharge or bleeding, No axillary or supraclavicular adenopathy Heart: regular rate and rhythm Abdomen: soft, non-tender, no masses,  no organomegaly Extremities: extremities normal, atraumatic, no cyanosis or  edema Skin: Skin color, texture, turgor normal. No rashes or lesions Lymph nodes: Cervical, supraclavicular, and axillary nodes normal. No abnormal inguinal nodes palpated Neurologic: Grossly normal  Pelvic: External genitalia:  no lesions              Urethra:  normal appearing urethra with no masses, tenderness or lesions              Bartholins and Skenes: normal                 Vagina: normal appearing vagina with normal color and discharge, no lesions              Cervix: no lesions                Bimanual Exam:  Uterus:  normal size, contour, position, consistency, mobility, non-tender              Adnexa: no mass, fullness, tenderness              Rectal exam: {yes no:314532}.  Confirms.              Anus:  normal sphincter tone, no lesions  Chaperone was present for exam:  ***  ASSESSMENT     PLAN     An After Visit Summary was printed and given to the patient.  ______ minutes face to face time of which over 50% was spent in counseling.

## 2022-11-17 NOTE — Progress Notes (Signed)
GYNECOLOGY  VISIT   HPI: 52 y.o.   Married  [place]  female   G1P1 with No LMP recorded. Patient is perimenopausal.   here for   HRT f/u visit. Pt is no longer having bleeding since last visit. Pt does not feel the pill is as effective as the patch, but knows the patch causes a skin reaction (the pill does not rid of night sweats)  Almost no hot flashes during the day.  Having night sweats. Wakes up once a night instead of many times a night.  Mood is better overall but not normal.  Using testosterone cream since February, three times per week. No effect on improving libido.  Has some fatigue.   Dealing with sciatica.  Stopped Neurontin last year. Doing Pilates.   Doing counseling once a week.   Her son will attend [employer], and this is a comfort for the patient.   Will go the [place].  GYNECOLOGIC HISTORY: No LMP recorded. Patient is perimenopausal. Contraception:  perimenopause/ vasectomy Menopausal hormone therapy:  estrace Last mammogram:  07/28/22 Breast Density Category D, BI-RADS CATEGORY 2 Benign bilateral breast cysts.  Last pap smear:   2022 at [facility], normal per patient         OB History     Gravida  1   Para  1   Term      Preterm      AB      Living         SAB      IAB      Ectopic      Multiple      Live Births                 Patient Active Problem List   Diagnosis Date Noted   Allergic rhinitis 12/24/2011   Fibrocystic breast 12/24/2011   Vitamin D deficiency 05/15/2011   Migraine headache 05/14/2011    Past Medical History:  Diagnosis Date   Allergy    Anxiety    Back pain     Past Surgical History:  Procedure Laterality Date   BREAST CYST ASPIRATION      Current Outpatient Medications  Medication Sig Dispense Refill   Cholecalciferol (VITAMIN D3) 2000 units capsule Take by mouth.     Cyanocobalamin (VITAMIN B-12 PO) Take by mouth.     estradiol (ESTRACE) 1 MG tablet Take 1 tablet (1 mg total) by  mouth daily. 30 tablet 2   gabapentin (NEURONTIN) 100 MG capsule Take 100 mg by mouth daily.     loratadine (CLARITIN) 10 MG tablet Take by mouth.     NONFORMULARY OR COMPOUNDED ITEM Rx:  Testosterone cream 0.1%.   S: Apply 0.5 gram to skin of arm, leg, or abdomen three days per week.  Rotate site.  Disp:  60 grams  RF:  none. 60 each 0   Omega-3 Fatty Acids (FISH OIL PO) Take 1 tablet by mouth daily.     Probiotic Product (PROBIOTIC DAILY PO) Take 1 tablet by mouth daily.     progesterone (PROMETRIUM) 100 MG capsule Take 1 capsule (100 mg total) by mouth at bedtime. 90 capsule 0   No current facility-administered medications for this visit.     ALLERGIES: Phenergan [promethazine hcl] and Hydrocodone-acetaminophen  Family History  Problem Relation Age of Onset   Colon polyps Mother    Hyperlipidemia Mother    Hypertension Mother    Esophageal cancer Paternal Uncle    Stroke  Maternal Grandmother    Stomach cancer Neg Hx    Rectal cancer Neg Hx     Social History   Socioeconomic History   Marital status: Married    Spouse name: Not on file   Number of children: Not on file   Years of education: Not on file   Highest education level: Not on file  Occupational History   Not on file  Tobacco Use   Smoking status: Never    Passive exposure: Past (father smoked)   Smokeless tobacco: Never  Vaping Use   Vaping Use: Never used  Substance and Sexual Activity   Alcohol use: Not Currently   Drug use: Never   Sexual activity: Yes    Birth control/protection: Surgical    Comment: husband vasectomy, first intercourse- 22, partners- 2  Other Topics Concern   Not on file  Social History Narrative   Not on file   Social Determinants of Health   Financial Resource Strain: Not on file  Food Insecurity: Not on file  Transportation Needs: Not on file  Physical Activity: Not on file  Stress: Not on file  Social Connections: Not on file  Intimate Partner Violence: Not on file     Review of Systems  All other systems reviewed and are negative.   PHYSICAL EXAMINATION:    BP 116/70 (BP Location: Left Arm, Patient Position: Sitting, Cuff Size: Normal)   Ht 5' 3.5" (1.613 m)   Wt 147 lb (66.7 kg)   SpO2 96%   BMI 25.63 kg/m     General appearance: alert, cooperative and appears stated age  ASSESSMENT  Hormone replacement therapy.  Medication monitoring.   PLAN  We discussed her current regimen and options for improving her hormone therapy.  We could increase her HRT (increase both estrogen and progesterone), restart gabapentin, or increase testosterone.  We will check her testosterone levels and adjust this first instead of increasing her estrogen/progesterone or restarting her gabapentin.  Refills of progesterone and estrogen.  FU for annual exam in October, 2024 and prn.     31 min total time was spent for this patient encounter, including preparation, face-to-face counseling with the patient, coordination of care, and documentation of the encounter.

## 2022-11-24 ENCOUNTER — Ambulatory Visit: Payer: BC Managed Care – PPO | Admitting: Obstetrics and Gynecology

## 2022-12-01 ENCOUNTER — Ambulatory Visit: Payer: BC Managed Care – PPO | Admitting: Obstetrics and Gynecology

## 2022-12-01 ENCOUNTER — Encounter: Payer: Self-pay | Admitting: Obstetrics and Gynecology

## 2022-12-01 VITALS — BP 116/70 | Ht 63.5 in | Wt 147.0 lb

## 2022-12-01 DIAGNOSIS — R61 Generalized hyperhidrosis: Secondary | ICD-10-CM | POA: Diagnosis not present

## 2022-12-01 DIAGNOSIS — Z5181 Encounter for therapeutic drug level monitoring: Secondary | ICD-10-CM

## 2022-12-01 DIAGNOSIS — Z79890 Hormone replacement therapy (postmenopausal): Secondary | ICD-10-CM | POA: Diagnosis not present

## 2022-12-01 DIAGNOSIS — E559 Vitamin D deficiency, unspecified: Secondary | ICD-10-CM | POA: Diagnosis not present

## 2022-12-01 DIAGNOSIS — R5383 Other fatigue: Secondary | ICD-10-CM | POA: Diagnosis not present

## 2022-12-01 MED ORDER — PROGESTERONE MICRONIZED 100 MG PO CAPS: 100.0000 mg | ORAL_CAPSULE | Freq: Every day | ORAL | 1 refills | Status: DC

## 2022-12-01 MED ORDER — ESTRADIOL 1 MG PO TABS: 1.0000 mg | ORAL_TABLET | Freq: Every day | ORAL | 1 refills | Status: DC

## 2022-12-04 LAB — TESTOS,TOTAL,FREE AND SHBG (FEMALE)
Free Testosterone: 0.4 pg/mL (ref 0.1–6.4)
Sex Hormone Binding: 81.6 nmol/L (ref 17–124)
Testosterone, Total, LC-MS-MS: 9 ng/dL (ref 2–45)

## 2022-12-06 ENCOUNTER — Other Ambulatory Visit: Payer: Self-pay | Admitting: Obstetrics and Gynecology

## 2022-12-06 MED ORDER — NONFORMULARY OR COMPOUNDED ITEM: 0 refills | Status: DC

## 2022-12-06 NOTE — Progress Notes (Signed)
Update on prescription for testosterone cream 1/2 gram to skin daily.

## 2022-12-09 DIAGNOSIS — R5383 Other fatigue: Secondary | ICD-10-CM | POA: Diagnosis not present

## 2022-12-09 DIAGNOSIS — R635 Abnormal weight gain: Secondary | ICD-10-CM | POA: Diagnosis not present

## 2022-12-09 DIAGNOSIS — R7989 Other specified abnormal findings of blood chemistry: Secondary | ICD-10-CM | POA: Diagnosis not present

## 2022-12-09 DIAGNOSIS — E559 Vitamin D deficiency, unspecified: Secondary | ICD-10-CM | POA: Diagnosis not present

## 2022-12-16 DIAGNOSIS — F331 Major depressive disorder, recurrent, moderate: Secondary | ICD-10-CM | POA: Diagnosis not present

## 2022-12-16 DIAGNOSIS — E559 Vitamin D deficiency, unspecified: Secondary | ICD-10-CM | POA: Diagnosis not present

## 2022-12-16 DIAGNOSIS — Z1331 Encounter for screening for depression: Secondary | ICD-10-CM | POA: Diagnosis not present

## 2022-12-16 DIAGNOSIS — Z0000 Encounter for general adult medical examination without abnormal findings: Secondary | ICD-10-CM | POA: Diagnosis not present

## 2022-12-16 DIAGNOSIS — J309 Allergic rhinitis, unspecified: Secondary | ICD-10-CM | POA: Diagnosis not present

## 2022-12-16 DIAGNOSIS — I8390 Asymptomatic varicose veins of unspecified lower extremity: Secondary | ICD-10-CM | POA: Diagnosis not present

## 2022-12-16 DIAGNOSIS — Z1339 Encounter for screening examination for other mental health and behavioral disorders: Secondary | ICD-10-CM | POA: Diagnosis not present

## 2022-12-31 ENCOUNTER — Other Ambulatory Visit: Payer: Self-pay | Admitting: *Deleted

## 2022-12-31 DIAGNOSIS — I8393 Asymptomatic varicose veins of bilateral lower extremities: Secondary | ICD-10-CM

## 2022-12-31 DIAGNOSIS — M5440 Lumbago with sciatica, unspecified side: Secondary | ICD-10-CM | POA: Diagnosis not present

## 2023-01-08 ENCOUNTER — Ambulatory Visit: Payer: BC Managed Care – PPO | Admitting: Pulmonary Disease

## 2023-01-08 ENCOUNTER — Ambulatory Visit (HOSPITAL_COMMUNITY)
Admission: RE | Admit: 2023-01-08 | Discharge: 2023-01-08 | Disposition: A | Discharge status: Home / Self Care | Payer: BC Managed Care – PPO | Source: Ambulatory Visit | Attending: Vascular Surgery | Admitting: Vascular Surgery

## 2023-01-08 ENCOUNTER — Encounter: Payer: Self-pay | Admitting: Pulmonary Disease

## 2023-01-08 VITALS — BP 120/68 | HR 89 | Temp 98.0°F | Ht 64.0 in | Wt 156.2 lb

## 2023-01-08 DIAGNOSIS — R0602 Shortness of breath: Secondary | ICD-10-CM | POA: Diagnosis not present

## 2023-01-08 DIAGNOSIS — I8393 Asymptomatic varicose veins of bilateral lower extremities: Secondary | ICD-10-CM | POA: Diagnosis not present

## 2023-01-08 MED ORDER — MONTELUKAST SODIUM 10 MG PO TABS: 10.0000 mg | ORAL_TABLET | Freq: Every day | ORAL | 3 refills | Status: DC

## 2023-01-08 MED ORDER — BUDESONIDE-FORMOTEROL FUMARATE 160-4.5 MCG/ACT IN AERO: 2.0000 | INHALATION_SPRAY | Freq: Two times a day (BID) | RESPIRATORY_TRACT | 3 refills | Status: AC

## 2023-01-08 NOTE — Progress Notes (Signed)
Madeline Wallace    [id number]    1971/06/12  Primary Care Physician:Alysia Penna, MD  Referring Physician: Alysia Penna, MD [address] [place],  [place] [address]  Chief complaint:   Patient being seen for chronic cough  HPI:  Patient had a cough since having COVID in 2021  Cough and asthma. For any particular time of the day It is a dry cough  She does have nasal stuffiness and congestion Does have environmental allergies -Denies history suggesting reflux  No past history of asthma  In the course of evaluation over the last few years she was placed on an inhaler at some point that did not seem to help  She does use Claritin regularly for allergies At some point was also started on Neurontin in the context of the cough  Does not have history of asthma in the family, as son had asthma in the past   Outpatient Encounter Medications as of 01/08/2023  Medication Sig   Cholecalciferol (VITAMIN D3) 2000 units capsule Take by mouth.   Cyanocobalamin (VITAMIN B-12 PO) Take by mouth.   estradiol (ESTRACE) 1 MG tablet Take 1 tablet (1 mg total) by mouth daily.   gabapentin (NEURONTIN) 100 MG capsule Take 100 mg by mouth daily.   loratadine (CLARITIN) 10 MG tablet Take by mouth.   NONFORMULARY OR COMPOUNDED ITEM Rx:  Testosterone cream 0.1%.   S: Apply 0.5 gram daily to skin of arm, leg, or abdomen.  Rotate site.  Disp:  60 grams  RF:  none.   Omega-3 Fatty Acids (FISH OIL PO) Take 1 tablet by mouth daily.   Probiotic Product (PROBIOTIC DAILY PO) Take 1 tablet by mouth daily.   progesterone (PROMETRIUM) 100 MG capsule Take 1 capsule (100 mg total) by mouth at bedtime.   No facility-administered encounter medications on file as of 01/08/2023.    Allergies as of 01/08/2023 - Review Complete 01/08/2023  Allergen Reaction Noted   Phenergan [promethazine hcl]  12/14/2016   Hydrocodone-acetaminophen Itching, Nausea And Vomiting, and Rash 12/14/2016    Past  Medical History:  Diagnosis Date   Allergy    Anxiety    Back pain     Past Surgical History:  Procedure Laterality Date   BREAST CYST ASPIRATION      Family History  Problem Relation Age of Onset   Colon polyps Mother    Hyperlipidemia Mother    Hypertension Mother    Esophageal cancer Paternal Uncle    Stroke Maternal Grandmother    Stomach cancer Neg Hx    Rectal cancer Neg Hx     Social History   Socioeconomic History   Marital status: Married    Spouse name: Not on file   Number of children: Not on file   Years of education: Not on file   Highest education level: Not on file  Occupational History   Not on file  Tobacco Use   Smoking status: Never    Passive exposure: Past (father smoked)   Smokeless tobacco: Never  Vaping Use   Vaping Use: Never used  Substance and Sexual Activity   Alcohol use: Not Currently   Drug use: Never   Sexual activity: Yes    Birth control/protection: Surgical    Comment: husband vasectomy, first intercourse- 22, partners- 2  Other Topics Concern   Not on file  Social History Narrative   Not on file   Social Determinants of Corporate investment banker  Strain: Not on file  Food Insecurity: Not on file  Transportation Needs: Not on file  Physical Activity: Not on file  Stress: Not on file  Social Connections: Not on file  Intimate Partner Violence: Not on file    Review of Systems  HENT:  Positive for sinus pressure.   Respiratory:  Positive for cough. Negative for shortness of breath.   Psychiatric/Behavioral:  Negative for sleep disturbance.     Vitals:   01/08/23 1423  BP: 120/68  Pulse: 89  Temp: 98 F (36.7 C)  SpO2: 98%     Physical Exam Constitutional:      Appearance: Normal appearance.  HENT:     Head: Normocephalic.     Nose: Nose normal.  Eyes:     Pupils: Pupils are equal, round, and reactive to light.  Cardiovascular:     Rate and Rhythm: Normal rate and regular rhythm.     Heart sounds:  No murmur heard.    No friction rub.  Pulmonary:     Effort: No respiratory distress.     Breath sounds: No stridor. No wheezing or rhonchi.  Musculoskeletal:     Cervical back: No rigidity or tenderness.  Neurological:     Mental Status: She is alert.  Psychiatric:        Mood and Affect: Mood normal.     Data Reviewed: Recent chest x-ray reviewed showing no acute infiltrate  Assessment:  Chronic cough  Post COVID airway hyperresponsiveness  Cough may be related to postnasal drip from sinus congestion/allergies -Does not have symptoms of reflux    Plan/Recommendations: Continue antihistamine-Claritin  Consider nasal steroid  Prescription for Singulair which should help allergies  Prescription for Symbicort 160, 2 puffs twice daily  Schedule for pulmonary function test  Follow-up in about 6 weeks  Encouraged to call with significant concerns   Virl Diamond MD [facility] 01/08/2023, 2:32 PM  CC: Alysia Penna, MD

## 2023-01-08 NOTE — Patient Instructions (Signed)
I will see you back in 4 to 6 weeks  Prescription for Singulair-10 mg daily   Prescription for Symbicort 2 puffs twice a day  Consider a nasal steroid should help with nasal congestion and stuffiness-Flonase/Nasonex examples-this is over-the-counter  Schedule for pulmonary function test  Call with significant concerns

## 2023-01-14 NOTE — Progress Notes (Deleted)
[facility]           OF Madeline Wallace  History and Physical   Madeline Wallace is a 52 y.o. female who presents with ***  ***  The pt does *** have hx of previous venous procedures. The patient has *** history of DVT. Pt does *** history of varicose vein.   Pt does *** history of skin changes in lower legs.   There is *** family history of venous disorders.   The patient has *** used compression stockings in the past.    The pt is not on a statin for cholesterol management.  The pt is not on a daily aspirin.   Other AC:  none The pt is not on medication for hypertension.   The pt is not on medication for diabetes.   Tobacco hx:  never  Pt does *** have family hx of AAA.  Past Medical History:  Diagnosis Date   Allergy    Anxiety    Back pain     Past Surgical History:  Procedure Laterality Date   BREAST CYST ASPIRATION      Social History   Socioeconomic History   Marital status: Married    Spouse name: Not on file   Number of children: Not on file   Years of education: Not on file   Highest education level: Not on file  Occupational History   Not on file  Tobacco Use   Smoking status: Never    Passive exposure: Past (father smoked)   Smokeless tobacco: Never  Vaping Use   Vaping Use: Never used  Substance and Sexual Activity   Alcohol use: Not Currently   Drug use: Never   Sexual activity: Yes    Birth control/protection: Surgical    Comment: husband vasectomy, first intercourse- 22, partners- 2  Other Topics Concern   Not on file  Social History Narrative   Not on file   Social Determinants of Health   Financial Resource Strain: Not on file  Food Insecurity: Not on file  Transportation Needs: Not on file  Physical Activity: Not on file  Stress: Not on file  Social Connections: Not on file  Intimate Partner Violence: Not on file    *** Family History  Problem Relation Age of Onset   Colon polyps Mother     Hyperlipidemia Mother    Hypertension Mother    Esophageal cancer Paternal Uncle    Stroke Maternal Grandmother    Stomach cancer Neg Hx    Rectal cancer Neg Hx     Current Outpatient Medications  Medication Sig Dispense Refill   budesonide-formoterol (SYMBICORT) 160-4.5 MCG/ACT inhaler Inhale 2 puffs into the lungs every 12 (twelve) hours. 10.2 g 3   Cholecalciferol (VITAMIN D3) 2000 units capsule Take by mouth.     Cyanocobalamin (VITAMIN B-12 PO) Take by mouth.     estradiol (ESTRACE) 1 MG tablet Take 1 tablet (1 mg total) by mouth daily. 90 tablet 1   gabapentin (NEURONTIN) 100 MG capsule Take 100 mg by mouth daily.     loratadine (CLARITIN) 10 MG tablet Take by mouth.     montelukast (SINGULAIR) 10 MG tablet Take 1 tablet (10 mg total) by mouth at bedtime. 30 tablet 3   NONFORMULARY OR COMPOUNDED ITEM Rx:  Testosterone cream 0.1%.   S: Apply 0.5 gram daily to skin of arm, leg, or abdomen.  Rotate site.  Disp:  60 grams  RF:  none. 60 each 0   Omega-3 Fatty Acids (FISH OIL PO) Take 1 tablet by mouth daily.     Probiotic Product (PROBIOTIC DAILY PO) Take 1 tablet by mouth daily.     progesterone (PROMETRIUM) 100 MG capsule Take 1 capsule (100 mg total) by mouth at bedtime. 90 capsule 1   No current facility-administered medications for this visit.    Allergies  Allergen Reactions   Phenergan [Promethazine Hcl]     Did not help with N/V    Hydrocodone-Acetaminophen Itching, Nausea And Vomiting and Rash    Rash, Dizzy    REVIEW OF SYSTEMS:  *** [X]  denotes positive finding, [ ]  denotes negative finding Cardiac  Comments:  Chest pain or chest pressure:    Shortness of breath upon exertion:    Short of breath when lying flat:    Irregular heart rhythm:        Vascular    Pain in calf, thigh, or hip brought on by ambulation:    Pain in feet at night that wakes you up from your sleep:     Blood clot in your veins:    Leg swelling:  x       Pulmonary    Oxygen at home:     Productive cough:     Wheezing:         Neurologic    Sudden weakness in arms or legs:     Sudden numbness in arms or legs:     Sudden onset of difficulty speaking or slurred speech:    Temporary loss of vision in one eye:     Problems with dizziness:         Gastrointestinal    Blood in stool:     Vomited blood:         Genitourinary    Burning when urinating:     Blood in urine:        Psychiatric    Major depression:         Hematologic    Bleeding problems:    Problems with blood clotting too easily:        Skin    Rashes or ulcers:        Constitutional    Fever or chills:      PHYSICAL EXAMINATION:  ***  General:  WDWN in NAD; vital signs documented above Gait: Not observed HENT: WNL, normocephalic Pulmonary: normal non-labored breathing without wheezing Cardiac: {Desc; regular/irreg:14544} HR; {With/Without:20273} carotid bruit*** Abdomen: soft, NT, aortic pulse is *** palpable Skin: {With/Without:20273} rashes Vascular Exam/Pulses:  Right Left  Radial {Exam; arterial pulse strength 0-4:30167} {Exam; arterial pulse strength 0-4:30167}  DP {Exam; arterial pulse strength 0-4:30167} {Exam; arterial pulse strength 0-4:30167}  PT {Exam; arterial pulse strength 0-4:30167} {Exam; arterial pulse strength 0-4:30167}   Extremities: ***  Neurologic: A&O X 3;  moving all extremities equally Psychiatric:  The pt has {Desc; normal/abnormal:11317::"Normal"} affect.   Non-Invasive Vascular Imaging:   Venous duplex on 01/08/2023: Venous Reflux Times  +--------------+---------+------+-----------+------------+--------+  RIGHT        Reflux NoRefluxReflux TimeDiameter cmsComments                          Yes                                   +--------------+---------+------+-----------+------------+--------+  CFV  no                                              +--------------+---------+------+-----------+------------+--------+  FV  mid        no                                              +--------------+---------+------+-----------+------------+--------+  Popliteal    no                                              +--------------+---------+------+-----------+------------+--------+  GSV at [facility]    no                            0.91              +--------------+---------+------+-----------+------------+--------+  GSV prox thighno                            0.46              +--------------+---------+------+-----------+------------+--------+  GSV mid thigh           yes    >500 ms      0.43              +--------------+---------+------+-----------+------------+--------+  GSV dist thighno                            0.38              +--------------+---------+------+-----------+------------+--------+  GSV at knee   no                            0.43              +--------------+---------+------+-----------+------------+--------+  GSV prox calf no                            0.32              +--------------+---------+------+-----------+------------+--------+  SSV Pop Fossa no                            0.36              +--------------+---------+------+-----------+------------+--------+   Summary:  Right:  - No evidence of deep vein thrombosis from the common femoral through the popliteal veins.  - No evidence of superficial venous thrombosis.  - The deep venous system is competent.  - The great saphenous vein is not competent in the mid thigh segment only.  - The small saphenous vein is competent.     Lagretta Colligan is a 52 y.o. female who presents with: ***    -pt has *** pedal pulses -pt does not have evidence of DVT.  Pt does have venous reflux in the superficial GSV in the mid thigh.  She does not have reflux at the [facility]. -discussed with pt about wearing  knee high 15-20 mmHg compression stockings and pt was measured for these today.   *** -discussed  the importance of leg elevation and how to elevate properly - pt is advised to elevate their legs and a diagram is given to them to demonstrate for pt to lay flat on their back with knees elevated and slightly bent with their feet higher than their knees, which puts their feet higher than their heart for 15 minutes per day.  If pt cannot lay flat, advised to lay as flat as possible.  -pt is advised to continue as much walking as possible and avoid sitting or standing for long periods of time.  -discussed importance of exercise and that water aerobics would also be beneficial.  -handout with recommendations given -pt will f/u ***   Doreatha Massed, [facility] [phone]  Clinic MD:  Chestine Spore

## 2023-01-28 DIAGNOSIS — M79604 Pain in right leg: Secondary | ICD-10-CM | POA: Diagnosis not present

## 2023-01-28 DIAGNOSIS — I781 Nevus, non-neoplastic: Secondary | ICD-10-CM | POA: Diagnosis not present

## 2023-01-28 DIAGNOSIS — M79662 Pain in left lower leg: Secondary | ICD-10-CM | POA: Diagnosis not present

## 2023-01-28 DIAGNOSIS — M7989 Other specified soft tissue disorders: Secondary | ICD-10-CM | POA: Diagnosis not present

## 2023-01-28 DIAGNOSIS — I87393 Chronic venous hypertension (idiopathic) with other complications of bilateral lower extremity: Secondary | ICD-10-CM | POA: Diagnosis not present

## 2023-01-28 DIAGNOSIS — M79661 Pain in right lower leg: Secondary | ICD-10-CM | POA: Diagnosis not present

## 2023-01-29 DIAGNOSIS — M5416 Radiculopathy, lumbar region: Secondary | ICD-10-CM | POA: Diagnosis not present

## 2023-01-31 ENCOUNTER — Other Ambulatory Visit: Payer: Self-pay | Admitting: Obstetrics and Gynecology

## 2023-01-31 DIAGNOSIS — Z79890 Hormone replacement therapy (postmenopausal): Secondary | ICD-10-CM

## 2023-02-01 NOTE — Telephone Encounter (Signed)
Med refill request: Prometrium Last AEX: 12/01/22 Next AEX: not scheduled Last MMG (if hormonal med) 07/28/22 Refill authorized: Please Advise, #90, 1 RF

## 2023-02-16 ENCOUNTER — Telehealth: Payer: Self-pay

## 2023-02-16 NOTE — Telephone Encounter (Signed)
Patient left message on triage line stating that [facility] sent her a text message stating she needed to call to schedule her mmg. Per patient her last imaging was 12/23 & was told that her next imaging would be in 97yr. I called [facility] & they couldn't see anything. I was transferred to another department & left a voicemail for callback.  Left patient a message letting her know I am currently working on this & will call her back once I get an answer from [facility].

## 2023-02-17 NOTE — Telephone Encounter (Signed)
Madeline Wallace stated that Dr Edward Jolly put in imaging order for patient on 11/23. Patient decided to go to the breast center to have it done instead. Order for Madeline Wallace was cancelled today so patient won't receive any more messages. I left a message letting patient know. Routing to Dr Edward Jolly.

## 2023-03-12 ENCOUNTER — Ambulatory Visit: Payer: BC Managed Care – PPO | Admitting: Nurse Practitioner

## 2023-03-16 DIAGNOSIS — M5416 Radiculopathy, lumbar region: Secondary | ICD-10-CM | POA: Diagnosis not present

## 2023-04-13 ENCOUNTER — Telehealth: Payer: Self-pay

## 2023-04-13 NOTE — Telephone Encounter (Signed)
Pt LVM in triage line stating that she is doing better on the HRTs. However, calling to report that cycle/bleeding has returned since Sunday. Flow is not quite as heavy as its been in the past but more than what pt would consider spotting.   FSH checked last on 10/07/2022.  Spoke w/ pt and confirmed LMP of 09/18/2022. However, in January, she reported that the bleeding was more like light brown discharge at that time but now is different.  States color is more red and flow is a bit more than that time. Also, reported some pain/cramping prior to the bleeding starting.   Please advise.

## 2023-04-13 NOTE — Telephone Encounter (Signed)
Please have patient make an appointment to see me for an office visit.  I would like to check her hormone levels.

## 2023-04-14 NOTE — Telephone Encounter (Signed)
LDVM on machine per DPR advising pt to make appt. Msg sent to appt desk.

## 2023-04-15 NOTE — Telephone Encounter (Signed)
Melton Alar, CMA Left message for patient to call and schedule appointment.

## 2023-04-27 NOTE — Telephone Encounter (Signed)
Spoke with patient, advised per Dr. Edward Jolly. OV scheduled for 05/04/23 at 11am.   Routing to provider for final review. Patient is agreeable to disposition. Will close encounter.

## 2023-04-28 DIAGNOSIS — R200 Anesthesia of skin: Secondary | ICD-10-CM | POA: Diagnosis not present

## 2023-05-04 ENCOUNTER — Ambulatory Visit: Payer: BC Managed Care – PPO | Admitting: Obstetrics and Gynecology

## 2023-05-04 ENCOUNTER — Encounter: Payer: Self-pay | Admitting: Obstetrics and Gynecology

## 2023-05-04 ENCOUNTER — Other Ambulatory Visit: Payer: Self-pay | Admitting: Obstetrics and Gynecology

## 2023-05-04 VITALS — BP 112/72 | HR 68 | Ht 63.5 in | Wt 158.0 lb

## 2023-05-04 DIAGNOSIS — N939 Abnormal uterine and vaginal bleeding, unspecified: Secondary | ICD-10-CM

## 2023-05-04 DIAGNOSIS — Z1231 Encounter for screening mammogram for malignant neoplasm of breast: Secondary | ICD-10-CM

## 2023-05-04 DIAGNOSIS — Z79890 Hormone replacement therapy (postmenopausal): Secondary | ICD-10-CM

## 2023-05-04 MED ORDER — NONFORMULARY OR COMPOUNDED ITEM: 0 refills | Status: AC

## 2023-05-04 MED ORDER — ESTRADIOL 1 MG PO TABS: 1.0000 mg | ORAL_TABLET | Freq: Every day | ORAL | 1 refills | Status: DC

## 2023-05-04 MED ORDER — PROGESTERONE MICRONIZED 100 MG PO CAPS
100.0000 mg | ORAL_CAPSULE | Freq: Every day | ORAL | 1 refills | Status: DC
Start: 2023-05-04 — End: 2023-07-06

## 2023-05-04 NOTE — Progress Notes (Signed)
GYNECOLOGY  VISIT   HPI: 52 y.o.   Married  [place]  female   G1P1 with Patient's last menstrual period was 03/29/2023.   here for   irregular bleeding.  Pt does not feel like her last period was normal with more bleeding than before with HRT. Wants to discuss sciatic pain. Also wants to discuss low BP and dizziness.    The bleeding lasted 6 days, accompanied by swelling.  Her prior LMP was December, 2023.  She wants to recheck her hormones. Needs a refill of testosterone and progesterone.  When she started her HRT, she had not skipped menses for a full calendar year.   It has been a stressful time with her child starting college.   Taking magnesium in the morning and at night.  It helps to calm her and to sleep.   Stopped gabapentin for sciatica last year.  Has had increased pain.  She has done PT.  Is doing pilates.  Sees neurosurgery every 3 months and does epidural txs.  She is waiting for vit B12 result from her PCP.  She is hoping for this treatment for her sciatica.   Doing treatment with a psychologist weekly.   Has appointment in December for annual exam.   GYNECOLOGIC HISTORY: Patient's last menstrual period was 03/29/2023. Contraception:  vasectomy Menopausal hormone therapy:  estrace Last mammogram:  07/28/22 Breast Density Category D, BI-RADS CATEGORY 2 Benign bilateral breast cysts.  Last pap smear:   2022 at [facility], normal per patient         OB History     Gravida  1   Para  1   Term      Preterm      AB      Living         SAB      IAB      Ectopic      Multiple      Live Births                 Patient Active Problem List   Diagnosis Date Noted   Allergic rhinitis 12/24/2011   Fibrocystic breast 12/24/2011   Vitamin D deficiency 05/15/2011   Migraine headache 05/14/2011    Past Medical History:  Diagnosis Date   Allergy    Anxiety    Back pain     Past Surgical History:  Procedure Laterality Date    BREAST CYST ASPIRATION      Current Outpatient Medications  Medication Sig Dispense Refill   budesonide-formoterol (SYMBICORT) 160-4.5 MCG/ACT inhaler Inhale 2 puffs into the lungs every 12 (twelve) hours. 10.2 g 3   Cholecalciferol (VITAMIN D3) 2000 units capsule Take by mouth.     Cyanocobalamin (VITAMIN B-12 PO) Take by mouth.     estradiol (ESTRACE) 1 MG tablet Take 1 tablet (1 mg total) by mouth daily. 90 tablet 1   gabapentin (NEURONTIN) 100 MG capsule Take 100 mg by mouth daily.     loratadine (CLARITIN) 10 MG tablet Take by mouth.     montelukast (SINGULAIR) 10 MG tablet Take 1 tablet (10 mg total) by mouth at bedtime. 30 tablet 3   NONFORMULARY OR COMPOUNDED ITEM Rx:  Testosterone cream 0.1%.   S: Apply 0.5 gram daily to skin of arm, leg, or abdomen.  Rotate site.  Disp:  60 grams  RF:  none. 60 each 0   Omega-3 Fatty Acids (FISH OIL PO) Take 1 tablet by mouth daily.  Probiotic Product (PROBIOTIC DAILY PO) Take 1 tablet by mouth daily.     progesterone (PROMETRIUM) 100 MG capsule TAKE 1 CAPSULE(100 MG) BY MOUTH AT BEDTIME 90 capsule 1   No current facility-administered medications for this visit.     ALLERGIES: Phenergan [promethazine hcl] and Hydrocodone-acetaminophen  Family History  Problem Relation Age of Onset   Colon polyps Mother    Hyperlipidemia Mother    Hypertension Mother    Esophageal cancer Paternal Uncle    Stroke Maternal Grandmother    Stomach cancer Neg Hx    Rectal cancer Neg Hx     Social History   Socioeconomic History   Marital status: Married    Spouse name: Not on file   Number of children: Not on file   Years of education: Not on file   Highest education level: Not on file  Occupational History   Not on file  Tobacco Use   Smoking status: Never    Passive exposure: Past (father smoked)   Smokeless tobacco: Never  Vaping Use   Vaping status: Never Used  Substance and Sexual Activity   Alcohol use: Not Currently   Drug use:  Never   Sexual activity: Yes    Birth control/protection: Surgical    Comment: husband vasectomy, first intercourse- 22, partners- 2  Other Topics Concern   Not on file  Social History Narrative   Not on file   Social Determinants of Health   Financial Resource Strain: Not on file  Food Insecurity: Not on file  Transportation Needs: Not on file  Physical Activity: Not on file  Stress: Not on file  Social Connections: Not on file  Intimate Partner Violence: Not on file    Review of Systems  All other systems reviewed and are negative.   PHYSICAL EXAMINATION:    BP 112/72 (BP Location: Right Arm, Patient Position: Sitting, Cuff Size: Normal)   Pulse 68   Ht 5' 3.5" (1.613 m)   Wt 158 lb (71.7 kg)   LMP 03/29/2023   SpO2 98%   BMI 27.55 kg/m     General appearance: alert, cooperative and appears stated age   ASSESSMENT  Perimenopausal female.  On HRT and testosterone treatment.    PLAN  Will check FSH, estradiol, and testosterone levels.  [facility].  HRT to [facility] on [address].  Plan for annual exam in December.

## 2023-05-07 LAB — TESTOS,TOTAL,FREE AND SHBG (FEMALE)
Free Testosterone: 1.1 pg/mL (ref 0.1–6.4)
Sex Hormone Binding: 103.5 nmol/L (ref 17–124)
Testosterone, Total, LC-MS-MS: 15 ng/dL (ref 2–45)

## 2023-05-07 LAB — ESTRADIOL: Estradiol: 42 pg/mL

## 2023-05-07 LAB — FOLLICLE STIMULATING HORMONE: FSH: 55.6 m[IU]/mL

## 2023-05-07 LAB — PROGESTERONE: Progesterone: 6.5 ng/mL

## 2023-05-11 DIAGNOSIS — M25562 Pain in left knee: Secondary | ICD-10-CM | POA: Diagnosis not present

## 2023-05-11 DIAGNOSIS — M25561 Pain in right knee: Secondary | ICD-10-CM | POA: Diagnosis not present

## 2023-05-12 ENCOUNTER — Other Ambulatory Visit: Payer: Self-pay | Admitting: *Deleted

## 2023-05-12 ENCOUNTER — Encounter: Payer: Self-pay | Admitting: *Deleted

## 2023-05-12 DIAGNOSIS — N950 Postmenopausal bleeding: Secondary | ICD-10-CM

## 2023-05-12 NOTE — Progress Notes (Signed)
Seen by patient Madeline Wallace on 05/10/2023 2:37 PM MyChart message to patient with instructions for PUS.  Order placed.

## 2023-05-14 NOTE — Telephone Encounter (Signed)
Pt LVM in triage line stating that she has not heard from anyone in regards to scheduling the f/u appt for the exam that BS recommended for her.  Spoke w/ pt via phone and advised her that [facility] sent mychart msg to her w/ directions to make appt w/ [facility] scheduling, but I didn't mind giving her the directions by phone.  Pt stated that she is not at home and didn't have any directions at the time to write down but will check account when she gets home for directions and will call to schedule.   Pt voiced appreciation for cb.

## 2023-05-19 NOTE — Addendum Note (Signed)
Addended by: Leda Min on: 05/19/2023 09:58 AM   Modules accepted: Orders

## 2023-05-19 NOTE — Addendum Note (Signed)
Addended by: Leda Min on: 05/19/2023 08:06 AM   Modules accepted: Orders

## 2023-05-20 ENCOUNTER — Ambulatory Visit (HOSPITAL_BASED_OUTPATIENT_CLINIC_OR_DEPARTMENT_OTHER): Payer: BC Managed Care – PPO

## 2023-05-25 ENCOUNTER — Other Ambulatory Visit: Payer: Self-pay | Admitting: Neurosurgery

## 2023-05-25 ENCOUNTER — Ambulatory Visit (HOSPITAL_COMMUNITY)
Admission: RE | Admit: 2023-05-25 | Discharge: 2023-05-25 | Disposition: A | Discharge status: Home / Self Care | Payer: BC Managed Care – PPO | Source: Ambulatory Visit | Attending: Obstetrics and Gynecology | Admitting: Obstetrics and Gynecology

## 2023-05-25 DIAGNOSIS — M5126 Other intervertebral disc displacement, lumbar region: Secondary | ICD-10-CM | POA: Diagnosis not present

## 2023-05-25 DIAGNOSIS — M5440 Lumbago with sciatica, unspecified side: Secondary | ICD-10-CM

## 2023-05-25 DIAGNOSIS — N950 Postmenopausal bleeding: Secondary | ICD-10-CM | POA: Insufficient documentation

## 2023-05-25 DIAGNOSIS — D251 Intramural leiomyoma of uterus: Secondary | ICD-10-CM | POA: Diagnosis not present

## 2023-06-07 ENCOUNTER — Other Ambulatory Visit: Payer: Self-pay

## 2023-06-07 ENCOUNTER — Other Ambulatory Visit: Payer: Self-pay | Admitting: Obstetrics and Gynecology

## 2023-06-07 DIAGNOSIS — R9389 Abnormal findings on diagnostic imaging of other specified body structures: Secondary | ICD-10-CM

## 2023-06-07 DIAGNOSIS — N950 Postmenopausal bleeding: Secondary | ICD-10-CM

## 2023-06-07 MED ORDER — ESTRADIOL 1 MG PO TABS: 1.0000 mg | ORAL_TABLET | Freq: Every day | ORAL | 0 refills | Status: DC

## 2023-06-07 NOTE — Telephone Encounter (Signed)
Pt LVM in triage line stating that she needed refills on her estradiol and also that she needed to schedule appt for procedure.

## 2023-06-07 NOTE — Telephone Encounter (Signed)
Before I contact this pt back regarding her medication/refills, due to her sxs and her recent US. Would you like for me to recommend that she discontinue taking her HRT for now until her eval is complete? Let me know, thanks.

## 2023-06-08 NOTE — Telephone Encounter (Signed)
LDVM on machine per DPR advising pt that refill was sent to give her time to schedule procedure. Notifying her that the appt desk will contact her to schedule but if she has any additional questions about the procedure then to call us back.

## 2023-06-09 DIAGNOSIS — M5416 Radiculopathy, lumbar region: Secondary | ICD-10-CM | POA: Diagnosis not present

## 2023-06-10 ENCOUNTER — Ambulatory Visit
Admission: RE | Admit: 2023-06-10 | Discharge: 2023-06-10 | Disposition: A | Discharge status: Home / Self Care | Payer: BC Managed Care – PPO | Source: Ambulatory Visit | Attending: Neurosurgery | Admitting: Neurosurgery

## 2023-06-10 DIAGNOSIS — R262 Difficulty in walking, not elsewhere classified: Secondary | ICD-10-CM | POA: Diagnosis not present

## 2023-06-10 DIAGNOSIS — M5440 Lumbago with sciatica, unspecified side: Secondary | ICD-10-CM

## 2023-06-10 DIAGNOSIS — M545 Low back pain, unspecified: Secondary | ICD-10-CM | POA: Diagnosis not present

## 2023-06-21 NOTE — Progress Notes (Signed)
GYNECOLOGY  VISIT   HPI: 52 y.o.   Married  [place]  female   G1P1 with No LMP recorded. Patient is perimenopausal.   here for   endometrial biopsy. Pt did want to discuss vaginal lump near left labia, causes no pain.  Not draining.   No more bleeding. Last time was in January.  Some increased heat at night and in the morning.  Less fatigue.   Using testosterone three times a day. Labs on 05/04/23: FSH 55.6 and E2 42 while on HRT>   Study Result  Narrative & Impression  CLINICAL DATA:  Postmenopausal bleeding   EXAM: TRANSABDOMINAL AND TRANSVAGINAL ULTRASOUND OF PELVIS   TECHNIQUE: Both transabdominal and transvaginal ultrasound examinations of the pelvis were performed. Transabdominal technique was performed for global imaging of the pelvis including uterus, ovaries, adnexal regions, and pelvic cul-de-sac. It was necessary to proceed with endovaginal exam following the transabdominal exam to visualize the endometrium.   COMPARISON:  None Available.   FINDINGS: Uterus   Measurements: 8.8 x 3.5 x 4.1 cm = volume: 65.8 mL. 9 x 8 x 8 mm intramural fibroid in the right posterior uterine body. 7 x 6 x 6 mm mural fibroid in the anterior uterine fundus.   Endometrium   Thickness: 5 mm.  No focal abnormality visualized.   Right ovary   Not discretely visualized.  No adnexal mass is seen.   Left ovary   Measurements: 2.7 x 1.2 x 2.6 cm = volume: 4.6 mL. Normal appearance/no adnexal mass.   Other findings   No abnormal free fluid.   IMPRESSION: Small uterine fibroids measuring up to 9 mm.   Endometrial complex measures 5 mm, within normal limits.   Right ovary is not discretely visualized.     Electronically Signed   By: Charline Bills M.D.   On: 06/03/2023 02:19     UPT today:  negative.    GYNECOLOGIC HISTORY: No LMP recorded. Patient is perimenopausal. Contraception:  vasectomy Menopausal hormone therapy:  estrace Last mammogram:   07/28/22  Breast Density Category D, BI-RADS CATEGORY 2 Benign bilateral breast cysts.  Last pap smear:    2022 at [facility], normal per patient         OB History     Gravida  1   Para  1   Term      Preterm      AB      Living         SAB      IAB      Ectopic      Multiple      Live Births                 Patient Active Problem List   Diagnosis Date Noted   Allergic rhinitis 12/24/2011   Fibrocystic breast 12/24/2011   Vitamin D deficiency 05/15/2011   Migraine headache 05/14/2011    Past Medical History:  Diagnosis Date   Allergy    Anxiety    Back pain     Past Surgical History:  Procedure Laterality Date   BREAST CYST ASPIRATION      Current Outpatient Medications  Medication Sig Dispense Refill   budesonide-formoterol (SYMBICORT) 160-4.5 MCG/ACT inhaler Inhale 2 puffs into the lungs every 12 (twelve) hours. 10.2 g 3   Cholecalciferol (VITAMIN D3) 2000 units capsule Take by mouth.     Cyanocobalamin (VITAMIN B-12 PO) Take by mouth.     estradiol (ESTRACE) 1 MG tablet  Take 1 tablet (1 mg total) by mouth daily. 30 tablet 0   gabapentin (NEURONTIN) 100 MG capsule Take 100 mg by mouth daily.     loratadine (CLARITIN) 10 MG tablet Take by mouth.     MAGNESIUM GLYCINATE PO Take by mouth.     montelukast (SINGULAIR) 10 MG tablet Take 1 tablet (10 mg total) by mouth at bedtime. 30 tablet 3   NONFORMULARY OR COMPOUNDED ITEM Rx:  Testosterone cream 0.1%.   S: Apply 0.5 gram daily to skin of arm, leg, or abdomen.  Rotate site.  Disp:  60 grams  RF:  none.  [facility]. 60 each 0   Omega-3 Fatty Acids (FISH OIL PO) Take 1 tablet by mouth daily.     Probiotic Product (PROBIOTIC DAILY PO) Take 1 tablet by mouth daily.     progesterone (PROMETRIUM) 100 MG capsule Take 1 capsule (100 mg total) by mouth daily. 90 capsule 1   No current facility-administered medications for this visit.     ALLERGIES: Phenergan [promethazine hcl] and  Hydrocodone-acetaminophen  Family History  Problem Relation Age of Onset   Colon polyps Mother    Hyperlipidemia Mother    Hypertension Mother    Esophageal cancer Paternal Uncle    Stroke Maternal Grandmother    Stomach cancer Neg Hx    Rectal cancer Neg Hx     Social History   Socioeconomic History   Marital status: Married    Spouse name: Not on file   Number of children: Not on file   Years of education: Not on file   Highest education level: Not on file  Occupational History   Not on file  Tobacco Use   Smoking status: Never    Passive exposure: Past (father smoked)   Smokeless tobacco: Never  Vaping Use   Vaping status: Never Used  Substance and Sexual Activity   Alcohol use: Not Currently   Drug use: Never   Sexual activity: Yes    Birth control/protection: Surgical    Comment: husband vasectomy, first intercourse- 22, partners- 2  Other Topics Concern   Not on file  Social History Narrative   Not on file   Social Determinants of Health   Financial Resource Strain: Not on file  Food Insecurity: Not on file  Transportation Needs: Not on file  Physical Activity: Not on file  Stress: Not on file  Social Connections: Not on file  Intimate Partner Violence: Not on file    Review of Systems  All other systems reviewed and are negative.   PHYSICAL EXAMINATION:    BP 126/78 (BP Location: Right Arm, Patient Position: Sitting, Cuff Size: Normal)   Pulse 67   Ht 5' 3.5" (1.613 m)   Wt 157 lb (71.2 kg)   SpO2 96%   BMI 27.38 kg/m     General appearance: alert, cooperative and appears stated age  External genitalia:  tiny sebaceous cysts.     EMB: Consent for procedure. Sterile prep with Hibiclens Paracervical block with 1% lidocaine 10 cc, lot number    [id number], expiration 04/2025 Tenaculum to anterior cervical lip. Os finder used. Pipelle passed to 8 cm twice.   Tissue to pathology.  Minimal EBL. No complications.   Chaperone was present for  exam:  Warren Lacy, CMA  ASSESSMENT  Postmenopausal bleeding on HRT.  Thickened endometrium.  Tiny fibroids.   PLAN  Pelvic US findings reviewed.  FU EMB.  She will continue her HRT.  Follow up  for annual exam and prn.

## 2023-07-05 ENCOUNTER — Other Ambulatory Visit (HOSPITAL_COMMUNITY)
Admission: RE | Admit: 2023-07-05 | Discharge: 2023-07-05 | Disposition: A | Discharge status: Home / Self Care | Payer: BC Managed Care – PPO | Source: Ambulatory Visit | Attending: Obstetrics and Gynecology | Admitting: Obstetrics and Gynecology

## 2023-07-05 ENCOUNTER — Encounter: Payer: Self-pay | Admitting: Obstetrics and Gynecology

## 2023-07-05 ENCOUNTER — Ambulatory Visit (INDEPENDENT_AMBULATORY_CARE_PROVIDER_SITE_OTHER): Payer: BC Managed Care – PPO | Admitting: Obstetrics and Gynecology

## 2023-07-05 VITALS — BP 126/78 | HR 67 | Ht 63.5 in | Wt 157.0 lb

## 2023-07-05 DIAGNOSIS — N950 Postmenopausal bleeding: Secondary | ICD-10-CM | POA: Diagnosis not present

## 2023-07-05 DIAGNOSIS — Z01812 Encounter for preprocedural laboratory examination: Secondary | ICD-10-CM | POA: Diagnosis not present

## 2023-07-05 DIAGNOSIS — Z79890 Hormone replacement therapy (postmenopausal): Secondary | ICD-10-CM | POA: Insufficient documentation

## 2023-07-05 DIAGNOSIS — R9389 Abnormal findings on diagnostic imaging of other specified body structures: Secondary | ICD-10-CM | POA: Insufficient documentation

## 2023-07-05 LAB — PREGNANCY, URINE: Preg Test, Ur: NEGATIVE

## 2023-07-05 NOTE — Patient Instructions (Signed)
 Endometrial Biopsy  An endometrial biopsy is a procedure where a tissue sample is removed from the lining of the uterus. This lining is called the endometrium. The tissue sample is then sent to a lab for testing. You may have this type of biopsy to check for: Cancer. Infection. Growths called polyps. Uterine bleeding that can't be explained. Tell a health care provider about: Any allergies you have. All medicines you're taking including vitamins, herbs, eye drops, creams, and over-the-counter medicines. Any problems you or family members have had with anesthesia. Any bleeding problems you have. Any surgeries you have had. Any medical problems you have. Whether you're pregnant or may be pregnant. What are the risks? Your health care provider will talk with you about risks. These may include: Bleeding. Infection. Allergic reactions to medicines. Damage to the wall of the uterus. This is rare. What happens before the procedure? Keep track of your period. You may need to have this biopsy when you're not having your period. Ask your provider about: Changing or stopping your regular medicines. These include any diabetes medicines or blood thinners you take. Taking medicines such as aspirin and ibuprofen. These medicines can thin your blood. Do not take them unless your provider tells you to. Taking over-the-counter medicines, vitamins, herbs, and supplements. Bring a pad with you. You may need to wear one after the biopsy. Plan to have a responsible adult take you home from the hospital or clinic. You won't be allowed to drive. What happens during the procedure? A tool will be put into your vagina to hold it open. This helps your provider see the cervix. The cervix is the lowest part of the uterus. Your cervix will be cleaned with a solution that kills germs. You will be given anesthesia. This keeps you from feeling pain. It will numb your cervix. A tool called forceps will be used to  hold your cervix steady. A thin tool called a uterine sound will be put through your cervix. It will be used to: Find the length of your uterus. Find where to take the sample from. A soft tube called a catheter will be put into your uterus. The catheter will remove a tissue sample. The tube and tools will be removed. The sample will be sent to a lab for testing. The procedure may vary among providers and hospitals. What happens after the procedure? Your blood pressure, heart rate, breathing rate, and blood oxygen level will be monitored until you leave the hospital or clinic. It's up to you to get the results of your procedure. Ask your provider, or the department that is doing the procedure, when your results will be ready. This information is not intended to replace advice given to you by your health care provider. Make sure you discuss any questions you have with your health care provider. Document Revised: 10/20/2022 Document Reviewed: 10/20/2022 Elsevier Patient Education  2024 [employer].

## 2023-07-06 ENCOUNTER — Other Ambulatory Visit: Payer: Self-pay | Admitting: Obstetrics and Gynecology

## 2023-07-06 DIAGNOSIS — Z79890 Hormone replacement therapy (postmenopausal): Secondary | ICD-10-CM

## 2023-07-06 DIAGNOSIS — M5416 Radiculopathy, lumbar region: Secondary | ICD-10-CM | POA: Diagnosis not present

## 2023-07-06 DIAGNOSIS — M5440 Lumbago with sciatica, unspecified side: Secondary | ICD-10-CM | POA: Diagnosis not present

## 2023-07-06 LAB — SURGICAL PATHOLOGY

## 2023-07-06 MED ORDER — PROGESTERONE 200 MG PO CAPS
200.0000 mg | ORAL_CAPSULE | Freq: Every day | ORAL | 1 refills | Status: DC
Start: 2023-07-06 — End: 2023-10-13

## 2023-07-13 DIAGNOSIS — M542 Cervicalgia: Secondary | ICD-10-CM | POA: Diagnosis not present

## 2023-07-13 DIAGNOSIS — M79672 Pain in left foot: Secondary | ICD-10-CM | POA: Diagnosis not present

## 2023-07-13 DIAGNOSIS — M545 Low back pain, unspecified: Secondary | ICD-10-CM | POA: Diagnosis not present

## 2023-07-20 DIAGNOSIS — M542 Cervicalgia: Secondary | ICD-10-CM | POA: Diagnosis not present

## 2023-07-20 DIAGNOSIS — M545 Low back pain, unspecified: Secondary | ICD-10-CM | POA: Diagnosis not present

## 2023-07-20 DIAGNOSIS — M79672 Pain in left foot: Secondary | ICD-10-CM | POA: Diagnosis not present

## 2023-07-26 DIAGNOSIS — M545 Low back pain, unspecified: Secondary | ICD-10-CM | POA: Diagnosis not present

## 2023-07-26 DIAGNOSIS — M79672 Pain in left foot: Secondary | ICD-10-CM | POA: Diagnosis not present

## 2023-07-26 DIAGNOSIS — M542 Cervicalgia: Secondary | ICD-10-CM | POA: Diagnosis not present

## 2023-07-29 DIAGNOSIS — M545 Low back pain, unspecified: Secondary | ICD-10-CM | POA: Diagnosis not present

## 2023-07-29 DIAGNOSIS — M542 Cervicalgia: Secondary | ICD-10-CM | POA: Diagnosis not present

## 2023-07-29 DIAGNOSIS — M79672 Pain in left foot: Secondary | ICD-10-CM | POA: Diagnosis not present

## 2023-07-30 ENCOUNTER — Ambulatory Visit
Admission: RE | Admit: 2023-07-30 | Discharge: 2023-07-30 | Disposition: A | Discharge status: Home / Self Care | Payer: BC Managed Care – PPO | Source: Ambulatory Visit | Attending: Obstetrics and Gynecology | Admitting: Obstetrics and Gynecology

## 2023-07-30 DIAGNOSIS — Z1231 Encounter for screening mammogram for malignant neoplasm of breast: Secondary | ICD-10-CM

## 2023-08-02 DIAGNOSIS — M542 Cervicalgia: Secondary | ICD-10-CM | POA: Diagnosis not present

## 2023-08-02 DIAGNOSIS — M79672 Pain in left foot: Secondary | ICD-10-CM | POA: Diagnosis not present

## 2023-08-02 DIAGNOSIS — M545 Low back pain, unspecified: Secondary | ICD-10-CM | POA: Diagnosis not present

## 2023-08-05 ENCOUNTER — Ambulatory Visit: Payer: BC Managed Care – PPO | Admitting: Obstetrics and Gynecology

## 2023-08-19 ENCOUNTER — Encounter: Payer: Self-pay | Admitting: Obstetrics and Gynecology

## 2023-08-19 DIAGNOSIS — D2262 Melanocytic nevi of left upper limb, including shoulder: Secondary | ICD-10-CM | POA: Diagnosis not present

## 2023-08-19 DIAGNOSIS — D2261 Melanocytic nevi of right upper limb, including shoulder: Secondary | ICD-10-CM | POA: Diagnosis not present

## 2023-08-19 DIAGNOSIS — D225 Melanocytic nevi of trunk: Secondary | ICD-10-CM | POA: Diagnosis not present

## 2023-08-19 DIAGNOSIS — L821 Other seborrheic keratosis: Secondary | ICD-10-CM | POA: Diagnosis not present

## 2023-09-29 DIAGNOSIS — M542 Cervicalgia: Secondary | ICD-10-CM | POA: Diagnosis not present

## 2023-09-29 DIAGNOSIS — M545 Low back pain, unspecified: Secondary | ICD-10-CM | POA: Diagnosis not present

## 2023-09-29 DIAGNOSIS — M79672 Pain in left foot: Secondary | ICD-10-CM | POA: Diagnosis not present

## 2023-10-07 NOTE — Progress Notes (Deleted)
 53 y.o. G1P1 Married [place] female here for annual exam.    PCP: Alysia Penna, MD   No LMP recorded. Patient is perimenopausal.           Sexually active: Yes.    The current method of family planning is vasectomy.    Menopausal hormone therapy:  estrace Exercising: {yes no:314532}  {types:19826} Smoker:  no  OB History  Gravida Para Term Preterm AB Living  1 1      SAB IAB Ectopic Multiple Live Births          # Outcome Date GA Lbr Len/2nd Weight Sex Type Anes PTL Lv  1 Para 12/09/04    M CS-LTranv        HEALTH MAINTENANCE: Last 2 paps:  2022 at [facility]  History of abnormal Pap or positive HPV:  no Mammogram:   07/30/23 Breast Density Cat D, BI-RADS CAT 1 neg Colonoscopy:  07/14/22 Bone Density:  n/a  Result  n/a   Immunization History  Administered Date(s) Administered   Influenza Split 06/12/2010   Influenza-Unspecified 06/05/2011, 07/01/2012   Tdap 12/24/2011      reports that she has never smoked. She has been exposed to tobacco smoke. She has never used smokeless tobacco. She reports that she does not currently use alcohol. She reports that she does not use drugs.  Past Medical History:  Diagnosis Date   Allergy    Anxiety    Back pain     Past Surgical History:  Procedure Laterality Date   BREAST CYST ASPIRATION      Current Outpatient Medications  Medication Sig Dispense Refill   budesonide-formoterol (SYMBICORT) 160-4.5 MCG/ACT inhaler Inhale 2 puffs into the lungs every 12 (twelve) hours. 10.2 g 3   Cholecalciferol (VITAMIN D3) 2000 units capsule Take by mouth.     Cyanocobalamin (VITAMIN B-12 PO) Take by mouth.     estradiol (ESTRACE) 1 MG tablet Take 1 tablet (1 mg total) by mouth daily. 30 tablet 0   gabapentin (NEURONTIN) 100 MG capsule Take 100 mg by mouth daily.     loratadine (CLARITIN) 10 MG tablet Take by mouth.     MAGNESIUM GLYCINATE PO Take by mouth.     montelukast (SINGULAIR) 10 MG tablet Take 1 tablet (10 mg total)  by mouth at bedtime. 30 tablet 3   NONFORMULARY OR COMPOUNDED ITEM Rx:  Testosterone cream 0.1%.   S: Apply 0.5 gram daily to skin of arm, leg, or abdomen.  Rotate site.  Disp:  60 grams  RF:  none.  [facility]. 60 each 0   Omega-3 Fatty Acids (FISH OIL PO) Take 1 tablet by mouth daily.     Probiotic Product (PROBIOTIC DAILY PO) Take 1 tablet by mouth daily.     progesterone (PROMETRIUM) 200 MG capsule Take 1 capsule (200 mg total) by mouth daily. Take at bedtime. 90 capsule 1   No current facility-administered medications for this visit.    ALLERGIES: Phenergan [promethazine hcl] and Hydrocodone-acetaminophen  Family History  Problem Relation Age of Onset   Colon polyps Mother    Hyperlipidemia Mother    Hypertension Mother    Esophageal cancer Paternal Uncle    Stroke Maternal Grandmother    Stomach cancer Neg Hx    Rectal cancer Neg Hx     Review of Systems  PHYSICAL EXAM:  There were no vitals taken for this visit.    General appearance: alert, cooperative and appears stated age Head: normocephalic,  without obvious abnormality, atraumatic Neck: no adenopathy, supple, symmetrical, trachea midline and thyroid normal to inspection and palpation Lungs: clear to auscultation bilaterally Breasts: normal appearance, no masses or tenderness, No nipple retraction or dimpling, No nipple discharge or bleeding, No axillary adenopathy Heart: regular rate and rhythm Abdomen: soft, non-tender; no masses, no organomegaly Extremities: extremities normal, atraumatic, no cyanosis or edema Skin: skin color, texture, turgor normal. No rashes or lesions Lymph nodes: cervical, supraclavicular, and axillary nodes normal. Neurologic: grossly normal  Pelvic: External genitalia:  no lesions              No abnormal inguinal nodes palpated.              Urethra:  normal appearing urethra with no masses, tenderness or lesions              Bartholins and Skenes: normal                  Vagina: normal appearing vagina with normal color and discharge, no lesions              Cervix: no lesions              Pap taken: {yes no:314532} Bimanual Exam:  Uterus:  normal size, contour, position, consistency, mobility, non-tender              Adnexa: no mass, fullness, tenderness              Rectal exam: {yes no:314532}.  Confirms.              Anus:  normal sphincter tone, no lesions  Chaperone was present for exam:  {BSCHAPERONE:31226::"Tenna Lacko F, CMA"}  ASSESSMENT: Well woman visit with gynecologic exam  ***  PLAN: Mammogram screening discussed. Self breast awareness reviewed. Pap and HRV collected:  {yes no:314532} Guidelines for Calcium, Vitamin D, regular exercise program including cardiovascular and weight bearing exercise. Medication refills:  *** {LABS (Optional):23779} Follow up:  ***    Additional counseling given.  {yes [id number]. ***  total time was spent for this patient encounter, including preparation, face-to-face counseling with the patient, coordination of care, and documentation of the encounter in addition to doing the well woman visit with gynecologic exam.

## 2023-10-13 ENCOUNTER — Other Ambulatory Visit: Payer: Self-pay

## 2023-10-13 DIAGNOSIS — Z79890 Hormone replacement therapy (postmenopausal): Secondary | ICD-10-CM

## 2023-10-13 MED ORDER — PROGESTERONE 200 MG PO CAPS: 200.0000 mg | ORAL_CAPSULE | Freq: Every day | ORAL | 1 refills | Status: DC

## 2023-10-13 NOTE — Telephone Encounter (Signed)
Medication refill request: progesterone Last ov:  07/05/23 Next AEX: 02/10/24 Last MMG (if hormonal medication request): 08/03/23 bi-rads 1 neg  Refill authorized: patient is going on holiday for 10 days and will run out of meds while on vacation.

## 2023-10-21 ENCOUNTER — Ambulatory Visit: Payer: BC Managed Care – PPO | Admitting: Obstetrics and Gynecology

## 2023-12-14 DIAGNOSIS — E559 Vitamin D deficiency, unspecified: Secondary | ICD-10-CM | POA: Diagnosis not present

## 2023-12-24 DIAGNOSIS — Z1339 Encounter for screening examination for other mental health and behavioral disorders: Secondary | ICD-10-CM | POA: Diagnosis not present

## 2023-12-24 DIAGNOSIS — E559 Vitamin D deficiency, unspecified: Secondary | ICD-10-CM | POA: Diagnosis not present

## 2023-12-24 DIAGNOSIS — Z0000 Encounter for general adult medical examination without abnormal findings: Secondary | ICD-10-CM | POA: Diagnosis not present

## 2023-12-24 DIAGNOSIS — Z1331 Encounter for screening for depression: Secondary | ICD-10-CM | POA: Diagnosis not present

## 2024-01-03 ENCOUNTER — Other Ambulatory Visit: Payer: Self-pay | Admitting: Obstetrics and Gynecology

## 2024-01-03 NOTE — Telephone Encounter (Signed)
 Med refill request: estradiol  (estrace ) 1 mg tab Last OV:  07/05/23 BS Next AEX: 02/10/24 BS Last MMG (if hormonal med) 07/30/23 Refill authorized: Last Rx sent #30 tab with zero refills on 06/07/23. Please approve or deny as appropriate.

## 2024-01-26 DIAGNOSIS — M542 Cervicalgia: Secondary | ICD-10-CM | POA: Diagnosis not present

## 2024-01-26 DIAGNOSIS — M545 Low back pain, unspecified: Secondary | ICD-10-CM | POA: Diagnosis not present

## 2024-01-26 DIAGNOSIS — M79672 Pain in left foot: Secondary | ICD-10-CM | POA: Diagnosis not present

## 2024-02-02 DIAGNOSIS — M545 Low back pain, unspecified: Secondary | ICD-10-CM | POA: Diagnosis not present

## 2024-02-02 DIAGNOSIS — M542 Cervicalgia: Secondary | ICD-10-CM | POA: Diagnosis not present

## 2024-02-02 DIAGNOSIS — M79672 Pain in left foot: Secondary | ICD-10-CM | POA: Diagnosis not present

## 2024-02-08 DIAGNOSIS — M79672 Pain in left foot: Secondary | ICD-10-CM | POA: Diagnosis not present

## 2024-02-08 DIAGNOSIS — M545 Low back pain, unspecified: Secondary | ICD-10-CM | POA: Diagnosis not present

## 2024-02-08 DIAGNOSIS — M542 Cervicalgia: Secondary | ICD-10-CM | POA: Diagnosis not present

## 2024-02-10 ENCOUNTER — Encounter: Payer: Self-pay | Admitting: Obstetrics and Gynecology

## 2024-02-10 ENCOUNTER — Other Ambulatory Visit (HOSPITAL_COMMUNITY)
Admission: RE | Admit: 2024-02-10 | Discharge: 2024-02-10 | Disposition: A | Discharge status: Home / Self Care | Source: Ambulatory Visit | Attending: Obstetrics and Gynecology | Admitting: Obstetrics and Gynecology

## 2024-02-10 ENCOUNTER — Ambulatory Visit (INDEPENDENT_AMBULATORY_CARE_PROVIDER_SITE_OTHER): Payer: BC Managed Care – PPO | Admitting: Obstetrics and Gynecology

## 2024-02-10 VITALS — BP 110/62 | HR 68 | Ht 64.5 in | Wt 161.0 lb

## 2024-02-10 DIAGNOSIS — Z124 Encounter for screening for malignant neoplasm of cervix: Secondary | ICD-10-CM | POA: Insufficient documentation

## 2024-02-10 DIAGNOSIS — Z01419 Encounter for gynecological examination (general) (routine) without abnormal findings: Secondary | ICD-10-CM | POA: Diagnosis not present

## 2024-02-10 DIAGNOSIS — Z1331 Encounter for screening for depression: Secondary | ICD-10-CM

## 2024-02-10 DIAGNOSIS — Z79890 Hormone replacement therapy (postmenopausal): Secondary | ICD-10-CM

## 2024-02-10 MED ORDER — ESTRADIOL 1 MG PO TABS: 1.0000 mg | ORAL_TABLET | Freq: Every day | ORAL | 3 refills | Status: AC

## 2024-02-10 MED ORDER — PROGESTERONE 200 MG PO CAPS: 200.0000 mg | ORAL_CAPSULE | Freq: Every day | ORAL | 3 refills | Status: AC

## 2024-02-10 NOTE — Progress Notes (Unsigned)
 53 y.o. G1P1 Married Caucasian female here for annual exam.    Followed for HRT and testosterone  tx.  Using testostone once a month.  Hot flashes much less.  Some night sweats but much less.  Some weight gain.    Had had postmenopausal bleeding.  US  07/05/23 showed tiny fibroids and EMS 5 mm. Right ovary not seen.  Left ovary normal.  EMB 07/05/23 superficial strips of endometrium in background of mucus. No stroma present, no atypia, proliferative in appearance.   No further bleeding.   Has a herniated disc and chronic sciatica.  Stopped gabapentin .  Did PT.  Her physical activity is diminished because of the pain.  Does pilates.   Has low vit D.  PCP: Barnetta Liberty, MD   Patient's last menstrual period was 03/29/2023.           Sexually active: Yes.    The current method of family planning is vasectomy.    Menopausal hormone therapy:  Estrace  and testosterone   Exercising: Yes.    Pilates  Smoker:  no  OB History  Gravida Para Term Preterm AB Living  1 1    1   SAB IAB Ectopic Multiple Live Births      1    # Outcome Date GA Lbr Len/2nd Weight Sex Type Anes PTL Lv  1 Para 12/09/04    M CS-LTranv        HEALTH MAINTENANCE: Last 2 paps:  around 2022/2023 per patient  History of abnormal Pap or positive HPV:  no Mammogram:   07/30/23 Breast Density Cat D, BIRADS Cat 1 neg  Colonoscopy:  07/14/22 Bone Density:  n/a  Result  n/a   Immunization History  Administered Date(s) Administered   Influenza Split 06/12/2010   Influenza-Unspecified 06/05/2011, 07/01/2012   Tdap 12/24/2011      reports that she has never smoked. She has been exposed to tobacco smoke. She has never used smokeless tobacco. She reports that she does not currently use alcohol . She reports that she does not use drugs.  Past Medical History:  Diagnosis Date   Allergy    Anxiety    Back pain     Past Surgical History:  Procedure Laterality Date   BREAST CYST ASPIRATION      Current  Outpatient Medications  Medication Sig Dispense Refill   albuterol (VENTOLIN HFA) 108 (90 Base) MCG/ACT inhaler Inhale 2 puffs into the lungs.     budesonide -formoterol  (SYMBICORT ) 160-4.5 MCG/ACT inhaler Inhale 2 puffs into the lungs every 12 (twelve) hours. 10.2 g 3   Cholecalciferol (VITAMIN D3) 2000 units capsule Take by mouth.     Cyanocobalamin  (VITAMIN B-12 PO) Take by mouth.     estradiol  (ESTRACE ) 1 MG tablet TAKE 1 TABLET(1 MG) BY MOUTH DAILY 90 tablet 0   loratadine (CLARITIN) 10 MG tablet Take by mouth.     MAGNESIUM GLYCINATE PO Take by mouth.     NONFORMULARY OR COMPOUNDED ITEM Rx:  Testosterone  cream 0.1%.   S: Apply 0.5 gram daily to skin of arm, leg, or abdomen.  Rotate site.  Disp:  60 grams  RF:  none.  [facility]. 60 each 0   Omega-3 Fatty Acids (FISH OIL PO) Take 1 tablet by mouth daily.     progesterone  (PROMETRIUM ) 200 MG capsule Take 1 capsule (200 mg total) by mouth daily. Take at bedtime. 90 capsule 1   No current facility-administered medications for this visit.    ALLERGIES: Patient has no active  allergies.  Family History  Problem Relation Age of Onset   Colon polyps Mother    Hyperlipidemia Mother    Hypertension Mother    Esophageal cancer Paternal Uncle    Stroke Maternal Grandmother    Stomach cancer Neg Hx    Rectal cancer Neg Hx     Review of Systems  PHYSICAL EXAM:  BP 110/62 (BP Location: Left Arm, Patient Position: Sitting)   Pulse 68   Ht 5' 4.5 (1.638 m)   Wt 161 lb (73 kg)   LMP 03/29/2023   SpO2 98%   BMI 27.21 kg/m     General appearance: alert, cooperative and appears stated age Head: normocephalic, without obvious abnormality, atraumatic Neck: no adenopathy, supple, symmetrical, trachea midline and thyroid  normal to inspection and palpation Lungs: clear to auscultation bilaterally Breasts: normal appearance, no masses or tenderness, No nipple retraction or dimpling, No nipple discharge or bleeding, No axillary  adenopathy Heart: regular rate and rhythm Abdomen: soft, non-tender; no masses, no organomegaly Extremities: extremities normal, atraumatic, no cyanosis or edema Skin: skin color, texture, turgor normal. No rashes or lesions Lymph nodes: cervical, supraclavicular, and axillary nodes normal. Neurologic: grossly normal  Pelvic: External genitalia:  no lesions              No abnormal inguinal nodes palpated.              Urethra:  normal appearing urethra with no masses, tenderness or lesions              Bartholins and Skenes: normal                 Vagina: normal appearing vagina with normal color and discharge, no lesions              Cervix: no lesions              Pap taken: {yes no:314532} Bimanual Exam:  Uterus:  normal size, contour, position, consistency, mobility, non-tender              Adnexa: no mass, fullness, tenderness              Rectal exam: {yes no:314532}.  Confirms.              Anus:  normal sphincter tone, no lesions  Chaperone was present for exam:  {BSCHAPERONE:31226::Emily F, CMA}  ASSESSMENT: Well woman visit with gynecologic exam.  PHQ-9: ***  ***  PLAN: Mammogram screening discussed. Self breast awareness reviewed. Pap and HRV collected:  {yes no:314532} Guidelines for Calcium, Vitamin D, regular exercise program including cardiovascular and weight bearing exercise. Medication refills:  *** {LABS (Optional):23779} [employer]. Follow up:  ***    Additional counseling given.  {yes [id number]. ***  total time was spent for this patient encounter, including preparation, face-to-face counseling with the patient, coordination of care, and documentation of the encounter in addition to doing the well woman visit with gynecologic exam.

## 2024-02-10 NOTE — Patient Instructions (Addendum)
 Consider joining [employer].  EXERCISE AND DIET:  We recommended that you start or continue a regular exercise program for good health. Regular exercise means any activity that makes your heart beat faster and makes you sweat.  We recommend exercising at least 30 minutes per day at least 3 days a week, preferably 4 or 5.  We also recommend a diet low in fat and sugar.  Inactivity, poor dietary choices and obesity can cause diabetes, heart attack, stroke, and kidney damage, among others.    ALCOHOL  AND SMOKING:  Women should limit their alcohol  intake to no more than 7 drinks/beers/glasses of wine (combined, not each!) per week. Moderation of alcohol  intake to this level decreases your risk of breast cancer and liver damage. And of course, no recreational drugs are part of a healthy lifestyle.  And absolutely no smoking or even second hand smoke. Most people know smoking can cause heart and lung diseases, but did you know it also contributes to weakening of your bones? Aging of your skin?  Yellowing of your teeth and nails?  CALCIUM AND VITAMIN D:  Adequate intake of calcium and Vitamin D are recommended.  The recommendations for exact amounts of these supplements seem to change often, but generally speaking 600 mg of calcium (either carbonate or citrate) and 800 units of Vitamin D per day seems prudent. Certain women may benefit from higher intake of Vitamin D.  If you are among these women, your doctor will have told you during your visit.    PAP SMEARS:  Pap smears, to check for cervical cancer or precancers,  have traditionally been done yearly, although recent scientific advances have shown that most women can have pap smears less often.  However, every woman still should have a physical exam from her gynecologist every year. It will include a breast check, inspection of the vulva and vagina to check for abnormal growths or skin changes, a visual exam of the cervix, and then an exam to evaluate the  size and shape of the uterus and ovaries.  And after 53 years of age, a rectal exam is indicated to check for rectal cancers. We will also provide age appropriate advice regarding health maintenance, like when you should have certain vaccines, screening for sexually transmitted diseases, bone density testing, colonoscopy, mammograms, etc.   MAMMOGRAMS:  All women over 53 years old should have a yearly mammogram. Many facilities now offer a 3D mammogram, which may cost around $50 extra out of pocket. If possible,  we recommend you accept the option to have the 3D mammogram performed.  It both reduces the number of women who will be called back for extra views which then turn out to be normal, and it is better than the routine mammogram at detecting truly abnormal areas.    COLONOSCOPY:  Colonoscopy to screen for colon cancer is recommended for all women at age 53.  We know, you hate the idea of the prep.  We agree, BUT, having colon cancer and not knowing it is worse!!  Colon cancer so often starts as a polyp that can be seen and removed at colonscopy, which can quite literally save your life!  And if your first colonoscopy is normal and you have no family history of colon cancer, most women don't have to have it again for 10 years.  Once every ten years, you can do something that may end up saving your life, right?  We will be happy to help you get it  scheduled when you are ready.  Be sure to check your insurance coverage so you understand how much it will cost.  It may be covered as a preventative service at no cost, but you should check your particular policy.

## 2024-02-17 ENCOUNTER — Ambulatory Visit: Payer: Self-pay | Admitting: Obstetrics and Gynecology

## 2024-02-17 LAB — CYTOLOGY - PAP
Comment: NEGATIVE
Diagnosis: NEGATIVE
High risk HPV: NEGATIVE

## 2024-04-14 ENCOUNTER — Telehealth: Payer: Self-pay

## 2024-04-14 NOTE — Telephone Encounter (Signed)
 Patient LM on RF line stating her progesterone  200 mg prescription did not have refills.  I contacted [facility] at [facility] and the pharmacy technician said the patient did have refills on her prescription, however their automated system was not working correctly.  The pharmacy technician will fill RX and it will be ready for the patient tomorrow morning.  I contacted the patient and notified her of the above and let her know her prescription should be ready tomorrow. Patient verbalized understanding.

## 2024-04-27 DIAGNOSIS — E663 Overweight: Secondary | ICD-10-CM | POA: Diagnosis not present

## 2024-04-27 DIAGNOSIS — Z1339 Encounter for screening examination for other mental health and behavioral disorders: Secondary | ICD-10-CM | POA: Diagnosis not present

## 2024-04-27 DIAGNOSIS — E559 Vitamin D deficiency, unspecified: Secondary | ICD-10-CM | POA: Diagnosis not present

## 2024-04-27 DIAGNOSIS — N951 Menopausal and female climacteric states: Secondary | ICD-10-CM | POA: Diagnosis not present

## 2024-04-27 DIAGNOSIS — M5441 Lumbago with sciatica, right side: Secondary | ICD-10-CM | POA: Diagnosis not present

## 2024-05-15 DIAGNOSIS — M47816 Spondylosis without myelopathy or radiculopathy, lumbar region: Secondary | ICD-10-CM | POA: Diagnosis not present

## 2024-07-03 ENCOUNTER — Telehealth: Payer: Self-pay

## 2024-07-03 NOTE — Telephone Encounter (Signed)
 I am not familiar with gynecologists in [place].  Most OB/GYN providers will be able to provide HRT care.   I wish her all the best and am happy to care for her until she finds a provider in her new city.

## 2024-07-03 NOTE — Telephone Encounter (Signed)
 Patient has moved to [place] and would like for Dr. Nikki to recommend a doctor there that does Hrts as well. Please advise.

## 2024-07-04 NOTE — Telephone Encounter (Signed)
 Patient aware

## 2024-07-28 DIAGNOSIS — J069 Acute upper respiratory infection, unspecified: Secondary | ICD-10-CM | POA: Diagnosis not present

## 2024-07-31 DIAGNOSIS — J069 Acute upper respiratory infection, unspecified: Secondary | ICD-10-CM | POA: Diagnosis not present

## 2025-02-19 ENCOUNTER — Ambulatory Visit: Admitting: Obstetrics and Gynecology
